# Patient Record
Sex: Female | Born: 1960
Health system: Southern US, Community
[De-identification: ages and names within clinical notes are randomized; demographics above are authoritative.]

## PROBLEM LIST (undated history)

## (undated) DIAGNOSIS — I10 Essential (primary) hypertension: Secondary | ICD-10-CM

## (undated) DIAGNOSIS — C801 Malignant (primary) neoplasm, unspecified: Secondary | ICD-10-CM

## (undated) DIAGNOSIS — R519 Headache, unspecified: Secondary | ICD-10-CM

## (undated) DIAGNOSIS — R51 Headache: Secondary | ICD-10-CM

---

## 1998-11-18 ENCOUNTER — Other Ambulatory Visit: Admission: RE | Admit: 1998-11-18 | Discharge: 1998-11-18 | Payer: Self-pay | Admitting: Gynecology

## 1999-09-08 ENCOUNTER — Other Ambulatory Visit: Admission: RE | Admit: 1999-09-08 | Discharge: 1999-09-08 | Payer: Self-pay | Admitting: Gynecology

## 1999-11-09 ENCOUNTER — Inpatient Hospital Stay (HOSPITAL_COMMUNITY): Admission: RE | Admit: 1999-11-09 | Discharge: 1999-11-11 | Payer: Self-pay | Admitting: Gynecology

## 2000-07-27 ENCOUNTER — Other Ambulatory Visit: Admission: RE | Admit: 2000-07-27 | Discharge: 2000-07-27 | Payer: Self-pay | Admitting: Obstetrics and Gynecology

## 2000-10-08 ENCOUNTER — Ambulatory Visit (HOSPITAL_COMMUNITY): Admission: RE | Admit: 2000-10-08 | Discharge: 2000-10-08 | Payer: Self-pay | Admitting: Obstetrics and Gynecology

## 2002-07-03 ENCOUNTER — Encounter: Payer: Self-pay | Admitting: *Deleted

## 2002-07-03 ENCOUNTER — Inpatient Hospital Stay (HOSPITAL_COMMUNITY): Admission: EM | Admit: 2002-07-03 | Discharge: 2002-07-05 | Payer: Self-pay | Admitting: Emergency Medicine

## 2002-07-04 ENCOUNTER — Encounter: Payer: Self-pay | Admitting: Cardiovascular Disease

## 2015-06-13 ENCOUNTER — Other Ambulatory Visit: Payer: Self-pay

## 2015-06-30 ENCOUNTER — Other Ambulatory Visit: Payer: Self-pay

## 2015-07-18 DIAGNOSIS — Z170 Estrogen receptor positive status [ER+]: Secondary | ICD-10-CM | POA: Diagnosis not present

## 2015-07-18 DIAGNOSIS — C50411 Malignant neoplasm of upper-outer quadrant of right female breast: Secondary | ICD-10-CM | POA: Diagnosis not present

## 2015-08-08 DIAGNOSIS — C50919 Malignant neoplasm of unspecified site of unspecified female breast: Secondary | ICD-10-CM | POA: Diagnosis not present

## 2015-08-25 DIAGNOSIS — Z510 Encounter for antineoplastic radiation therapy: Secondary | ICD-10-CM | POA: Diagnosis not present

## 2015-08-25 DIAGNOSIS — R300 Dysuria: Secondary | ICD-10-CM | POA: Diagnosis not present

## 2015-08-25 DIAGNOSIS — C50411 Malignant neoplasm of upper-outer quadrant of right female breast: Secondary | ICD-10-CM | POA: Diagnosis not present

## 2015-08-26 DIAGNOSIS — R300 Dysuria: Secondary | ICD-10-CM | POA: Diagnosis not present

## 2015-08-26 DIAGNOSIS — Z510 Encounter for antineoplastic radiation therapy: Secondary | ICD-10-CM | POA: Diagnosis not present

## 2015-08-26 DIAGNOSIS — C50411 Malignant neoplasm of upper-outer quadrant of right female breast: Secondary | ICD-10-CM | POA: Diagnosis not present

## 2015-08-27 DIAGNOSIS — Z510 Encounter for antineoplastic radiation therapy: Secondary | ICD-10-CM | POA: Diagnosis not present

## 2015-08-27 DIAGNOSIS — R300 Dysuria: Secondary | ICD-10-CM | POA: Diagnosis not present

## 2015-08-27 DIAGNOSIS — C50411 Malignant neoplasm of upper-outer quadrant of right female breast: Secondary | ICD-10-CM | POA: Diagnosis not present

## 2015-08-28 DIAGNOSIS — Z510 Encounter for antineoplastic radiation therapy: Secondary | ICD-10-CM | POA: Diagnosis not present

## 2015-08-28 DIAGNOSIS — C50411 Malignant neoplasm of upper-outer quadrant of right female breast: Secondary | ICD-10-CM | POA: Diagnosis not present

## 2015-08-28 DIAGNOSIS — R300 Dysuria: Secondary | ICD-10-CM | POA: Diagnosis not present

## 2015-08-29 DIAGNOSIS — Z510 Encounter for antineoplastic radiation therapy: Secondary | ICD-10-CM | POA: Diagnosis not present

## 2015-08-29 DIAGNOSIS — C50411 Malignant neoplasm of upper-outer quadrant of right female breast: Secondary | ICD-10-CM | POA: Diagnosis not present

## 2015-08-29 DIAGNOSIS — R300 Dysuria: Secondary | ICD-10-CM | POA: Diagnosis not present

## 2015-09-01 DIAGNOSIS — Z510 Encounter for antineoplastic radiation therapy: Secondary | ICD-10-CM | POA: Diagnosis not present

## 2015-09-01 DIAGNOSIS — C50411 Malignant neoplasm of upper-outer quadrant of right female breast: Secondary | ICD-10-CM | POA: Diagnosis not present

## 2015-09-01 DIAGNOSIS — R300 Dysuria: Secondary | ICD-10-CM | POA: Diagnosis not present

## 2015-09-02 DIAGNOSIS — R300 Dysuria: Secondary | ICD-10-CM | POA: Diagnosis not present

## 2015-09-02 DIAGNOSIS — C50411 Malignant neoplasm of upper-outer quadrant of right female breast: Secondary | ICD-10-CM | POA: Diagnosis not present

## 2015-09-02 DIAGNOSIS — Z510 Encounter for antineoplastic radiation therapy: Secondary | ICD-10-CM | POA: Diagnosis not present

## 2015-09-03 DIAGNOSIS — Z510 Encounter for antineoplastic radiation therapy: Secondary | ICD-10-CM | POA: Diagnosis not present

## 2015-09-03 DIAGNOSIS — C50411 Malignant neoplasm of upper-outer quadrant of right female breast: Secondary | ICD-10-CM | POA: Diagnosis not present

## 2015-09-03 DIAGNOSIS — R300 Dysuria: Secondary | ICD-10-CM | POA: Diagnosis not present

## 2015-09-04 DIAGNOSIS — C50411 Malignant neoplasm of upper-outer quadrant of right female breast: Secondary | ICD-10-CM | POA: Diagnosis not present

## 2015-09-04 DIAGNOSIS — R300 Dysuria: Secondary | ICD-10-CM | POA: Diagnosis not present

## 2015-09-04 DIAGNOSIS — Z510 Encounter for antineoplastic radiation therapy: Secondary | ICD-10-CM | POA: Diagnosis not present

## 2015-09-08 DIAGNOSIS — C50411 Malignant neoplasm of upper-outer quadrant of right female breast: Secondary | ICD-10-CM | POA: Diagnosis not present

## 2015-09-08 DIAGNOSIS — Z510 Encounter for antineoplastic radiation therapy: Secondary | ICD-10-CM | POA: Diagnosis not present

## 2015-09-08 DIAGNOSIS — R300 Dysuria: Secondary | ICD-10-CM | POA: Diagnosis not present

## 2015-09-09 DIAGNOSIS — Z510 Encounter for antineoplastic radiation therapy: Secondary | ICD-10-CM | POA: Diagnosis not present

## 2015-09-09 DIAGNOSIS — R300 Dysuria: Secondary | ICD-10-CM | POA: Diagnosis not present

## 2015-09-09 DIAGNOSIS — C50411 Malignant neoplasm of upper-outer quadrant of right female breast: Secondary | ICD-10-CM | POA: Diagnosis not present

## 2015-09-10 DIAGNOSIS — R3915 Urgency of urination: Secondary | ICD-10-CM | POA: Diagnosis not present

## 2015-09-10 DIAGNOSIS — Z1389 Encounter for screening for other disorder: Secondary | ICD-10-CM | POA: Diagnosis not present

## 2015-09-10 DIAGNOSIS — Z510 Encounter for antineoplastic radiation therapy: Secondary | ICD-10-CM | POA: Diagnosis not present

## 2015-09-10 DIAGNOSIS — Z124 Encounter for screening for malignant neoplasm of cervix: Secondary | ICD-10-CM | POA: Diagnosis not present

## 2015-09-10 DIAGNOSIS — C50411 Malignant neoplasm of upper-outer quadrant of right female breast: Secondary | ICD-10-CM | POA: Diagnosis not present

## 2015-09-10 DIAGNOSIS — R300 Dysuria: Secondary | ICD-10-CM | POA: Diagnosis not present

## 2015-09-11 DIAGNOSIS — R300 Dysuria: Secondary | ICD-10-CM | POA: Diagnosis not present

## 2015-09-11 DIAGNOSIS — C50411 Malignant neoplasm of upper-outer quadrant of right female breast: Secondary | ICD-10-CM | POA: Diagnosis not present

## 2015-09-11 DIAGNOSIS — Z510 Encounter for antineoplastic radiation therapy: Secondary | ICD-10-CM | POA: Diagnosis not present

## 2015-09-12 DIAGNOSIS — C50411 Malignant neoplasm of upper-outer quadrant of right female breast: Secondary | ICD-10-CM | POA: Diagnosis not present

## 2015-09-12 DIAGNOSIS — R300 Dysuria: Secondary | ICD-10-CM | POA: Diagnosis not present

## 2015-09-12 DIAGNOSIS — Z510 Encounter for antineoplastic radiation therapy: Secondary | ICD-10-CM | POA: Diagnosis not present

## 2015-09-15 DIAGNOSIS — R300 Dysuria: Secondary | ICD-10-CM | POA: Diagnosis not present

## 2015-09-15 DIAGNOSIS — Z510 Encounter for antineoplastic radiation therapy: Secondary | ICD-10-CM | POA: Diagnosis not present

## 2015-09-15 DIAGNOSIS — C50411 Malignant neoplasm of upper-outer quadrant of right female breast: Secondary | ICD-10-CM | POA: Diagnosis not present

## 2015-09-16 DIAGNOSIS — Z510 Encounter for antineoplastic radiation therapy: Secondary | ICD-10-CM | POA: Diagnosis not present

## 2015-09-16 DIAGNOSIS — R300 Dysuria: Secondary | ICD-10-CM | POA: Diagnosis not present

## 2015-09-16 DIAGNOSIS — C50411 Malignant neoplasm of upper-outer quadrant of right female breast: Secondary | ICD-10-CM | POA: Diagnosis not present

## 2015-09-17 DIAGNOSIS — R300 Dysuria: Secondary | ICD-10-CM | POA: Diagnosis not present

## 2015-09-17 DIAGNOSIS — Z510 Encounter for antineoplastic radiation therapy: Secondary | ICD-10-CM | POA: Diagnosis not present

## 2015-09-17 DIAGNOSIS — C50411 Malignant neoplasm of upper-outer quadrant of right female breast: Secondary | ICD-10-CM | POA: Diagnosis not present

## 2015-09-18 DIAGNOSIS — R300 Dysuria: Secondary | ICD-10-CM | POA: Diagnosis not present

## 2015-09-18 DIAGNOSIS — C50411 Malignant neoplasm of upper-outer quadrant of right female breast: Secondary | ICD-10-CM | POA: Diagnosis not present

## 2015-09-18 DIAGNOSIS — Z510 Encounter for antineoplastic radiation therapy: Secondary | ICD-10-CM | POA: Diagnosis not present

## 2015-09-19 DIAGNOSIS — Z510 Encounter for antineoplastic radiation therapy: Secondary | ICD-10-CM | POA: Diagnosis not present

## 2015-09-19 DIAGNOSIS — R300 Dysuria: Secondary | ICD-10-CM | POA: Diagnosis not present

## 2015-09-19 DIAGNOSIS — C50411 Malignant neoplasm of upper-outer quadrant of right female breast: Secondary | ICD-10-CM | POA: Diagnosis not present

## 2015-09-20 DIAGNOSIS — H5213 Myopia, bilateral: Secondary | ICD-10-CM | POA: Diagnosis not present

## 2015-09-22 DIAGNOSIS — Z510 Encounter for antineoplastic radiation therapy: Secondary | ICD-10-CM | POA: Diagnosis not present

## 2015-09-22 DIAGNOSIS — C50411 Malignant neoplasm of upper-outer quadrant of right female breast: Secondary | ICD-10-CM | POA: Diagnosis not present

## 2015-09-23 DIAGNOSIS — Z510 Encounter for antineoplastic radiation therapy: Secondary | ICD-10-CM | POA: Diagnosis not present

## 2015-09-23 DIAGNOSIS — C50411 Malignant neoplasm of upper-outer quadrant of right female breast: Secondary | ICD-10-CM | POA: Diagnosis not present

## 2015-09-24 DIAGNOSIS — Z510 Encounter for antineoplastic radiation therapy: Secondary | ICD-10-CM | POA: Diagnosis not present

## 2015-09-24 DIAGNOSIS — C50411 Malignant neoplasm of upper-outer quadrant of right female breast: Secondary | ICD-10-CM | POA: Diagnosis not present

## 2015-09-25 DIAGNOSIS — Z510 Encounter for antineoplastic radiation therapy: Secondary | ICD-10-CM | POA: Diagnosis not present

## 2015-09-25 DIAGNOSIS — C50411 Malignant neoplasm of upper-outer quadrant of right female breast: Secondary | ICD-10-CM | POA: Diagnosis not present

## 2015-09-26 DIAGNOSIS — C50411 Malignant neoplasm of upper-outer quadrant of right female breast: Secondary | ICD-10-CM | POA: Diagnosis not present

## 2015-09-26 DIAGNOSIS — Z510 Encounter for antineoplastic radiation therapy: Secondary | ICD-10-CM | POA: Diagnosis not present

## 2015-09-29 DIAGNOSIS — Z510 Encounter for antineoplastic radiation therapy: Secondary | ICD-10-CM | POA: Diagnosis not present

## 2015-09-29 DIAGNOSIS — C50411 Malignant neoplasm of upper-outer quadrant of right female breast: Secondary | ICD-10-CM | POA: Diagnosis not present

## 2015-09-30 DIAGNOSIS — C50411 Malignant neoplasm of upper-outer quadrant of right female breast: Secondary | ICD-10-CM | POA: Diagnosis not present

## 2015-09-30 DIAGNOSIS — Z510 Encounter for antineoplastic radiation therapy: Secondary | ICD-10-CM | POA: Diagnosis not present

## 2015-10-01 DIAGNOSIS — C50411 Malignant neoplasm of upper-outer quadrant of right female breast: Secondary | ICD-10-CM | POA: Diagnosis not present

## 2015-10-01 DIAGNOSIS — Z510 Encounter for antineoplastic radiation therapy: Secondary | ICD-10-CM | POA: Diagnosis not present

## 2015-10-02 DIAGNOSIS — Z510 Encounter for antineoplastic radiation therapy: Secondary | ICD-10-CM | POA: Diagnosis not present

## 2015-10-02 DIAGNOSIS — C50411 Malignant neoplasm of upper-outer quadrant of right female breast: Secondary | ICD-10-CM | POA: Diagnosis not present

## 2015-10-03 DIAGNOSIS — C50411 Malignant neoplasm of upper-outer quadrant of right female breast: Secondary | ICD-10-CM | POA: Diagnosis not present

## 2015-10-03 DIAGNOSIS — Z510 Encounter for antineoplastic radiation therapy: Secondary | ICD-10-CM | POA: Diagnosis not present

## 2015-10-06 DIAGNOSIS — Z510 Encounter for antineoplastic radiation therapy: Secondary | ICD-10-CM | POA: Diagnosis not present

## 2015-10-06 DIAGNOSIS — C50411 Malignant neoplasm of upper-outer quadrant of right female breast: Secondary | ICD-10-CM | POA: Diagnosis not present

## 2015-10-07 DIAGNOSIS — Z510 Encounter for antineoplastic radiation therapy: Secondary | ICD-10-CM | POA: Diagnosis not present

## 2015-10-07 DIAGNOSIS — C50411 Malignant neoplasm of upper-outer quadrant of right female breast: Secondary | ICD-10-CM | POA: Diagnosis not present

## 2015-10-08 DIAGNOSIS — C50411 Malignant neoplasm of upper-outer quadrant of right female breast: Secondary | ICD-10-CM | POA: Diagnosis not present

## 2015-10-08 DIAGNOSIS — Z510 Encounter for antineoplastic radiation therapy: Secondary | ICD-10-CM | POA: Diagnosis not present

## 2015-10-09 DIAGNOSIS — C50411 Malignant neoplasm of upper-outer quadrant of right female breast: Secondary | ICD-10-CM | POA: Diagnosis not present

## 2015-10-09 DIAGNOSIS — Z510 Encounter for antineoplastic radiation therapy: Secondary | ICD-10-CM | POA: Diagnosis not present

## 2015-11-07 DIAGNOSIS — C50411 Malignant neoplasm of upper-outer quadrant of right female breast: Secondary | ICD-10-CM | POA: Diagnosis not present

## 2015-11-28 DIAGNOSIS — Z170 Estrogen receptor positive status [ER+]: Secondary | ICD-10-CM | POA: Diagnosis not present

## 2015-11-28 DIAGNOSIS — C50411 Malignant neoplasm of upper-outer quadrant of right female breast: Secondary | ICD-10-CM | POA: Diagnosis not present

## 2015-11-28 DIAGNOSIS — Z8041 Family history of malignant neoplasm of ovary: Secondary | ICD-10-CM | POA: Diagnosis not present

## 2015-11-28 DIAGNOSIS — Z8042 Family history of malignant neoplasm of prostate: Secondary | ICD-10-CM | POA: Diagnosis not present

## 2015-11-28 DIAGNOSIS — C50919 Malignant neoplasm of unspecified site of unspecified female breast: Secondary | ICD-10-CM | POA: Diagnosis not present

## 2015-11-28 DIAGNOSIS — Z853 Personal history of malignant neoplasm of breast: Secondary | ICD-10-CM | POA: Diagnosis not present

## 2015-11-28 DIAGNOSIS — N6459 Other signs and symptoms in breast: Secondary | ICD-10-CM | POA: Diagnosis not present

## 2015-11-28 DIAGNOSIS — Z800 Family history of malignant neoplasm of digestive organs: Secondary | ICD-10-CM | POA: Diagnosis not present

## 2015-11-28 DIAGNOSIS — Z79810 Long term (current) use of selective estrogen receptor modulators (SERMs): Secondary | ICD-10-CM | POA: Diagnosis not present

## 2015-12-03 DIAGNOSIS — N63 Unspecified lump in breast: Secondary | ICD-10-CM | POA: Diagnosis not present

## 2015-12-03 DIAGNOSIS — C50411 Malignant neoplasm of upper-outer quadrant of right female breast: Secondary | ICD-10-CM | POA: Diagnosis not present

## 2015-12-03 DIAGNOSIS — L7634 Postprocedural seroma of skin and subcutaneous tissue following other procedure: Secondary | ICD-10-CM | POA: Diagnosis not present

## 2015-12-17 DIAGNOSIS — Z6830 Body mass index (BMI) 30.0-30.9, adult: Secondary | ICD-10-CM | POA: Diagnosis not present

## 2015-12-17 DIAGNOSIS — F411 Generalized anxiety disorder: Secondary | ICD-10-CM | POA: Diagnosis not present

## 2015-12-17 DIAGNOSIS — S0990XD Unspecified injury of head, subsequent encounter: Secondary | ICD-10-CM | POA: Diagnosis not present

## 2015-12-17 DIAGNOSIS — G43009 Migraine without aura, not intractable, without status migrainosus: Secondary | ICD-10-CM | POA: Diagnosis not present

## 2015-12-31 DIAGNOSIS — C50411 Malignant neoplasm of upper-outer quadrant of right female breast: Secondary | ICD-10-CM | POA: Diagnosis not present

## 2016-03-15 DIAGNOSIS — J0120 Acute ethmoidal sinusitis, unspecified: Secondary | ICD-10-CM | POA: Diagnosis not present

## 2016-03-15 DIAGNOSIS — Z23 Encounter for immunization: Secondary | ICD-10-CM | POA: Diagnosis not present

## 2016-05-28 DIAGNOSIS — Z170 Estrogen receptor positive status [ER+]: Secondary | ICD-10-CM | POA: Diagnosis not present

## 2016-05-28 DIAGNOSIS — Z853 Personal history of malignant neoplasm of breast: Secondary | ICD-10-CM | POA: Diagnosis not present

## 2016-05-28 DIAGNOSIS — Z79810 Long term (current) use of selective estrogen receptor modulators (SERMs): Secondary | ICD-10-CM | POA: Diagnosis not present

## 2016-05-28 DIAGNOSIS — C50411 Malignant neoplasm of upper-outer quadrant of right female breast: Secondary | ICD-10-CM | POA: Diagnosis not present

## 2016-05-31 DIAGNOSIS — H52223 Regular astigmatism, bilateral: Secondary | ICD-10-CM | POA: Diagnosis not present

## 2016-07-07 DIAGNOSIS — H6983 Other specified disorders of Eustachian tube, bilateral: Secondary | ICD-10-CM | POA: Diagnosis not present

## 2016-07-07 DIAGNOSIS — R509 Fever, unspecified: Secondary | ICD-10-CM | POA: Diagnosis not present

## 2016-07-07 DIAGNOSIS — J111 Influenza due to unidentified influenza virus with other respiratory manifestations: Secondary | ICD-10-CM | POA: Diagnosis not present

## 2016-09-24 DIAGNOSIS — Z853 Personal history of malignant neoplasm of breast: Secondary | ICD-10-CM | POA: Diagnosis not present

## 2016-09-24 DIAGNOSIS — Z923 Personal history of irradiation: Secondary | ICD-10-CM | POA: Diagnosis not present

## 2016-09-24 DIAGNOSIS — Z170 Estrogen receptor positive status [ER+]: Secondary | ICD-10-CM | POA: Diagnosis not present

## 2016-09-24 DIAGNOSIS — Z79810 Long term (current) use of selective estrogen receptor modulators (SERMs): Secondary | ICD-10-CM | POA: Diagnosis not present

## 2016-09-24 DIAGNOSIS — C50911 Malignant neoplasm of unspecified site of right female breast: Secondary | ICD-10-CM | POA: Diagnosis not present

## 2016-09-27 DIAGNOSIS — J309 Allergic rhinitis, unspecified: Secondary | ICD-10-CM | POA: Diagnosis not present

## 2016-09-27 DIAGNOSIS — J329 Chronic sinusitis, unspecified: Secondary | ICD-10-CM | POA: Diagnosis not present

## 2016-09-27 DIAGNOSIS — J40 Bronchitis, not specified as acute or chronic: Secondary | ICD-10-CM | POA: Diagnosis not present

## 2016-10-07 DIAGNOSIS — R928 Other abnormal and inconclusive findings on diagnostic imaging of breast: Secondary | ICD-10-CM | POA: Diagnosis not present

## 2016-10-07 DIAGNOSIS — C50411 Malignant neoplasm of upper-outer quadrant of right female breast: Secondary | ICD-10-CM | POA: Diagnosis not present

## 2016-11-08 DIAGNOSIS — Z6831 Body mass index (BMI) 31.0-31.9, adult: Secondary | ICD-10-CM | POA: Diagnosis not present

## 2016-11-08 DIAGNOSIS — K2970 Gastritis, unspecified, without bleeding: Secondary | ICD-10-CM | POA: Diagnosis not present

## 2017-01-27 DIAGNOSIS — Z79810 Long term (current) use of selective estrogen receptor modulators (SERMs): Secondary | ICD-10-CM | POA: Diagnosis not present

## 2017-01-27 DIAGNOSIS — Z923 Personal history of irradiation: Secondary | ICD-10-CM | POA: Diagnosis not present

## 2017-01-27 DIAGNOSIS — C50911 Malignant neoplasm of unspecified site of right female breast: Secondary | ICD-10-CM | POA: Diagnosis not present

## 2017-01-27 DIAGNOSIS — Z170 Estrogen receptor positive status [ER+]: Secondary | ICD-10-CM | POA: Diagnosis not present

## 2017-01-27 DIAGNOSIS — Z853 Personal history of malignant neoplasm of breast: Secondary | ICD-10-CM | POA: Diagnosis not present

## 2017-03-20 DIAGNOSIS — Z23 Encounter for immunization: Secondary | ICD-10-CM | POA: Diagnosis not present

## 2017-05-14 DIAGNOSIS — R911 Solitary pulmonary nodule: Secondary | ICD-10-CM | POA: Diagnosis not present

## 2017-05-14 DIAGNOSIS — R9431 Abnormal electrocardiogram [ECG] [EKG]: Secondary | ICD-10-CM | POA: Diagnosis not present

## 2017-05-14 DIAGNOSIS — R531 Weakness: Secondary | ICD-10-CM | POA: Diagnosis not present

## 2017-05-14 DIAGNOSIS — R05 Cough: Secondary | ICD-10-CM | POA: Diagnosis not present

## 2017-05-15 ENCOUNTER — Observation Stay (HOSPITAL_COMMUNITY): Payer: BLUE CROSS/BLUE SHIELD

## 2017-05-15 ENCOUNTER — Observation Stay (HOSPITAL_COMMUNITY)
Admission: AD | Admit: 2017-05-15 | Discharge: 2017-05-15 | Disposition: A | Discharge status: Home / Self Care | Payer: BLUE CROSS/BLUE SHIELD | Source: Other Acute Inpatient Hospital | Attending: Internal Medicine | Admitting: Internal Medicine

## 2017-05-15 ENCOUNTER — Encounter (HOSPITAL_COMMUNITY): Payer: Self-pay | Admitting: *Deleted

## 2017-05-15 ENCOUNTER — Observation Stay (HOSPITAL_BASED_OUTPATIENT_CLINIC_OR_DEPARTMENT_OTHER): Payer: BLUE CROSS/BLUE SHIELD

## 2017-05-15 ENCOUNTER — Other Ambulatory Visit: Payer: Self-pay

## 2017-05-15 DIAGNOSIS — R51 Headache: Secondary | ICD-10-CM | POA: Diagnosis not present

## 2017-05-15 DIAGNOSIS — Z79899 Other long term (current) drug therapy: Secondary | ICD-10-CM | POA: Diagnosis not present

## 2017-05-15 DIAGNOSIS — E876 Hypokalemia: Secondary | ICD-10-CM | POA: Insufficient documentation

## 2017-05-15 DIAGNOSIS — R9431 Abnormal electrocardiogram [ECG] [EKG]: Secondary | ICD-10-CM | POA: Diagnosis not present

## 2017-05-15 DIAGNOSIS — I5030 Unspecified diastolic (congestive) heart failure: Secondary | ICD-10-CM | POA: Diagnosis not present

## 2017-05-15 DIAGNOSIS — Z853 Personal history of malignant neoplasm of breast: Secondary | ICD-10-CM | POA: Diagnosis not present

## 2017-05-15 DIAGNOSIS — Z882 Allergy status to sulfonamides status: Secondary | ICD-10-CM | POA: Insufficient documentation

## 2017-05-15 DIAGNOSIS — Z885 Allergy status to narcotic agent status: Secondary | ICD-10-CM | POA: Diagnosis not present

## 2017-05-15 DIAGNOSIS — I10 Essential (primary) hypertension: Secondary | ICD-10-CM | POA: Diagnosis not present

## 2017-05-15 DIAGNOSIS — R42 Dizziness and giddiness: Principal | ICD-10-CM

## 2017-05-15 DIAGNOSIS — F329 Major depressive disorder, single episode, unspecified: Secondary | ICD-10-CM | POA: Insufficient documentation

## 2017-05-15 DIAGNOSIS — R519 Headache, unspecified: Secondary | ICD-10-CM | POA: Diagnosis present

## 2017-05-15 HISTORY — DX: Headache: R51

## 2017-05-15 HISTORY — DX: Malignant (primary) neoplasm, unspecified: C80.1

## 2017-05-15 HISTORY — DX: Headache, unspecified: R51.9

## 2017-05-15 HISTORY — DX: Essential (primary) hypertension: I10

## 2017-05-15 LAB — COMPREHENSIVE METABOLIC PANEL
ALBUMIN: 3.3 g/dL — AB (ref 3.5–5.0)
ALK PHOS: 49 U/L (ref 38–126)
ALT: 19 U/L (ref 14–54)
ANION GAP: 6 (ref 5–15)
AST: 18 U/L (ref 15–41)
BUN: 13 mg/dL (ref 6–20)
CALCIUM: 8.5 mg/dL — AB (ref 8.9–10.3)
CHLORIDE: 106 mmol/L (ref 101–111)
CO2: 25 mmol/L (ref 22–32)
Creatinine, Ser: 0.7 mg/dL (ref 0.44–1.00)
GFR calc non Af Amer: >60 mL/min (ref >60)
GLUCOSE: 96 mg/dL (ref 65–99)
POTASSIUM: 3 mmol/L — AB (ref 3.5–5.1)
SODIUM: 137 mmol/L (ref 135–145)
Total Bilirubin: 0.8 mg/dL (ref 0.3–1.2)
Total Protein: 6 g/dL — ABNORMAL LOW (ref 6.5–8.1)

## 2017-05-15 LAB — HEMOGLOBIN A1C
HEMOGLOBIN A1C: 5.4 % (ref 4.8–5.6)
MEAN PLASMA GLUCOSE: 108.28 mg/dL

## 2017-05-15 LAB — CBC
HCT: 37.7 % (ref 36.0–46.0)
Hemoglobin: 12.8 g/dL (ref 12.0–15.0)
MCH: 30.5 pg (ref 26.0–34.0)
MCHC: 34 g/dL (ref 30.0–36.0)
MCV: 90 fL (ref 78.0–100.0)
PLATELETS: 162 10*3/uL (ref 150–400)
RBC: 4.19 MIL/uL (ref 3.87–5.11)
RDW: 13 % (ref 11.5–15.5)
WBC: 7.8 10*3/uL (ref 4.0–10.5)

## 2017-05-15 LAB — TROPONIN I

## 2017-05-15 LAB — BRAIN NATRIURETIC PEPTIDE: B NATRIURETIC PEPTIDE 5: 28.6 pg/mL (ref 0.0–100.0)

## 2017-05-15 LAB — LIPID PANEL
CHOL/HDL RATIO: 3.1 ratio
Cholesterol: 130 mg/dL (ref 0–200)
HDL: 42 mg/dL (ref >40)
LDL CALC: 54 mg/dL (ref 0–99)
Triglycerides: 171 mg/dL — ABNORMAL HIGH (ref <150)
VLDL: 34 mg/dL (ref 0–40)

## 2017-05-15 LAB — HIV ANTIBODY (ROUTINE TESTING W REFLEX): HIV SCREEN 4TH GENERATION: NONREACTIVE

## 2017-05-15 LAB — MAGNESIUM: Magnesium: 2 mg/dL (ref 1.7–2.4)

## 2017-05-15 MED ORDER — LOSARTAN POTASSIUM 100 MG PO TABS: 100.0000 mg | ORAL_TABLET | Freq: Every day | ORAL | 0 refills | Status: AC

## 2017-05-15 MED ORDER — PERFLUTREN LIPID MICROSPHERE
1.0000 mL | INTRAVENOUS | Status: DC | PRN
  Administered 2017-05-15: 4 mL via INTRAVENOUS
  Filled 2017-05-15: qty 10

## 2017-05-15 MED ORDER — ASPIRIN 300 MG RE SUPP: 300.0000 mg | RECTAL | Status: AC

## 2017-05-15 MED ORDER — SODIUM CHLORIDE 0.9 % IV SOLN: 250.0000 mL | INTRAVENOUS | Status: DC | PRN

## 2017-05-15 MED ORDER — ASPIRIN 81 MG PO CHEW: 324.0000 mg | CHEWABLE_TABLET | ORAL | Status: AC

## 2017-05-15 MED ORDER — SODIUM CHLORIDE 0.9% FLUSH: 3.0000 mL | INTRAVENOUS | Status: DC | PRN

## 2017-05-15 MED ORDER — ACETAMINOPHEN 325 MG PO TABS: 650.0000 mg | ORAL_TABLET | ORAL | Status: DC | PRN

## 2017-05-15 MED ORDER — HYDROCHLOROTHIAZIDE 25 MG PO TABS
25.0000 mg | ORAL_TABLET | Freq: Every day | ORAL | Status: DC
  Filled 2017-05-15: qty 1

## 2017-05-15 MED ORDER — ENOXAPARIN SODIUM 40 MG/0.4ML ~~LOC~~ SOLN
40.0000 mg | Freq: Every day | SUBCUTANEOUS | Status: DC
  Administered 2017-05-15: 40 mg via SUBCUTANEOUS
  Filled 2017-05-15: qty 0.4

## 2017-05-15 MED ORDER — SODIUM CHLORIDE 0.9% FLUSH
3.0000 mL | Freq: Two times a day (BID) | INTRAVENOUS | Status: DC
  Administered 2017-05-15: 3 mL via INTRAVENOUS

## 2017-05-15 MED ORDER — MECLIZINE HCL 25 MG PO TABS: 25.0000 mg | ORAL_TABLET | Freq: Three times a day (TID) | ORAL | 0 refills | Status: AC | PRN

## 2017-05-15 MED ORDER — MECLIZINE HCL 25 MG PO TABS: 25.0000 mg | ORAL_TABLET | Freq: Three times a day (TID) | ORAL | Status: DC | PRN

## 2017-05-15 MED ORDER — HYDROCODONE-ACETAMINOPHEN 5-325 MG PO TABS
1.0000 | ORAL_TABLET | ORAL | Status: DC | PRN
  Administered 2017-05-15: 1 via ORAL
  Filled 2017-05-15: qty 1

## 2017-05-15 MED ORDER — GADOBENATE DIMEGLUMINE 529 MG/ML IV SOLN
15.0000 mL | Freq: Once | INTRAVENOUS | Status: AC
  Administered 2017-05-15: 15 mL via INTRAVENOUS

## 2017-05-15 MED ORDER — ASPIRIN 81 MG PO TBEC: 81.0000 mg | DELAYED_RELEASE_TABLET | Freq: Every day | ORAL | 0 refills | Status: AC

## 2017-05-15 MED ORDER — NITROGLYCERIN 0.4 MG SL SUBL: 0.4000 mg | SUBLINGUAL_TABLET | SUBLINGUAL | Status: DC | PRN

## 2017-05-15 MED ORDER — KETOROLAC TROMETHAMINE 30 MG/ML IJ SOLN: 30.0000 mg | Freq: Four times a day (QID) | INTRAMUSCULAR | Status: DC | PRN

## 2017-05-15 MED ORDER — BISACODYL 5 MG PO TBEC: 5.0000 mg | DELAYED_RELEASE_TABLET | Freq: Every day | ORAL | Status: DC | PRN

## 2017-05-15 MED ORDER — ENOXAPARIN SODIUM 40 MG/0.4ML ~~LOC~~ SOLN: 40.0000 mg | SUBCUTANEOUS | Status: DC

## 2017-05-15 MED ORDER — LOSARTAN POTASSIUM 50 MG PO TABS
100.0000 mg | ORAL_TABLET | Freq: Every day | ORAL | Status: DC
  Administered 2017-05-15: 100 mg via ORAL
  Filled 2017-05-15: qty 2

## 2017-05-15 MED ORDER — POTASSIUM CHLORIDE CRYS ER 20 MEQ PO TBCR
40.0000 meq | EXTENDED_RELEASE_TABLET | Freq: Once | ORAL | Status: AC
  Administered 2017-05-15: 40 meq via ORAL
  Filled 2017-05-15: qty 2

## 2017-05-15 MED ORDER — ASPIRIN EC 81 MG PO TBEC
81.0000 mg | DELAYED_RELEASE_TABLET | Freq: Every day | ORAL | Status: DC
  Administered 2017-05-15: 81 mg via ORAL
  Filled 2017-05-15: qty 1

## 2017-05-15 MED ORDER — SODIUM CHLORIDE 0.9% FLUSH
3.0000 mL | Freq: Two times a day (BID) | INTRAVENOUS | Status: DC
Start: 2017-05-15 — End: 2017-05-15
  Administered 2017-05-15: 3 mL via INTRAVENOUS

## 2017-05-15 MED ORDER — ONDANSETRON HCL 4 MG/2ML IJ SOLN: 4.0000 mg | Freq: Four times a day (QID) | INTRAMUSCULAR | Status: DC | PRN

## 2017-05-15 MED ORDER — SENNOSIDES-DOCUSATE SODIUM 8.6-50 MG PO TABS: 1.0000 | ORAL_TABLET | Freq: Every evening | ORAL | Status: DC | PRN

## 2017-05-15 NOTE — Evaluation (Addendum)
Physical Therapy Evaluation/Vestibular Assessment Patient Details Name: Deanna Sanchez MRN: [id number] DOB: 1961/04/03 Today's Date: 05/15/2017   History of Present Illness  56 y.o. female admitted with dizziness and nausea.  Dx is suspicious for vestibular dysfunction.  She also has some T wave inversion on EKG, so cardiology has been consulted.  Pt also with hypokalemia.  MRI negative for acute infarct, but does show some old cerebellar infarcts (but nothing to explain this acute episode). Pt with significant PMH of HTN, HA, CA, and per pt episodic vertigo x 1 year.   Clinical Impression  Vestibular assessment completed.  Pt presents with a L ear hypofunction of unknown origin.  Marye Round and horizontal canal testing was mildly symptomatic, but lacked traditional nystagmus and significant spinning symptoms.  Pt has not had meclizine today.  She is unsteady on her feet, and I initiated compensatory strategies and seated x 1 exercises to help with her gaze stability.  She lives closer to [place] than here and would benefit from seeing one of my colleges at [employer] that specializes in Vestibular assessment and treatment.   PT to follow acutely for deficits listed below.       Follow Up Recommendations Outpatient PT;Other (comment)(for vesitublar and balance therapy -[facility] in [place])    Equipment Recommendations  None recommended by PT    Recommendations for Other Services   NA    Precautions / Restrictions Precautions Precautions: Fall Precaution Comments: mildly unsteady on her feet, especially with head turns during gait.  Restrictions Weight Bearing Restrictions: No      Mobility  Bed Mobility Overal bed mobility: Independent                Transfers Overall transfer level: Modified independent               General transfer comment: slower than I would anticipate as normal.  Discussed sitting for a minute before standing and standing for a  minute before walking for safety.  And we also discussed visual targeting to help decreased sensation of dizziness.   Ambulation/Gait Ambulation/Gait assistance: Min guard Ambulation Distance (Feet): 150 Feet Assistive device: 1 person hand held assist Gait Pattern/deviations: Staggering left;Staggering right Gait velocity: decreased Gait velocity interpretation: Below normal speed for age/gender General Gait Details: mildly staggering gait pattern with min guard hand held assist.  Staggering intensifies with head turns and turning in general.  Pt walking slower and more cautiously than her normal gait speed.          Balance Overall balance assessment: Needs assistance Sitting-balance support: Feet supported;No upper extremity supported Sitting balance-Leahy Scale: Good     Standing balance support: No upper extremity supported Standing balance-Leahy Scale: Fair         05/15/17 0947  Vestibular Assessment  General Observation Wears bifocals, no hearing loss, no tinnitus (currently), no HA (currently), no recent head trauma, but does have h/o skull fx 8-10 years ago, no double vision or blurry vision, MRI shows old cerebellar infarcts, vision due to be checked in Jan, no recent URI or antibiotic use, no changes in meds.  Fells off balance, swaing.    Symptom Behavior  Type of Dizziness Imbalance  Frequency of Dizziness when up and moving  Duration of Dizziness near constant when up  Aggravating Factors Turning head quickly;Turning body quickly;Activity in general  Relieving Factors Lying supine;Closing eyes  Occulomotor Exam  Occulomotor Alignment Normal  Spontaneous Absent  Gaze-induced Right beating nystagmus with  R gaze  Smooth Pursuits Comment (nystagmus (horizontal) right beating noted), test of skew was negative.    Saccades Dysmetria  Vestibulo-Occular Reflex  VOR 1 Head Only (x 1 viewing) vertical is much more difficult and symptomatic than horizontal  Comment HIT  (+) L  Auditory  Comments grossly equal with finger rub test  Other Tests  Tragal negative  Positional Testing  Dix-Hallpike Dix-Hallpike Right;Dix-Hallpike Left  Horizontal Canal Testing Horizontal Canal Right;Horizontal Canal Left  Dix-Hallpike Right  Dix-Hallpike Right Duration 0  Dix-Hallpike Right Symptoms No nystagmus;Other (comment) (some very mild symptoms with movement)  Dix-Hallpike Left  Dix-Hallpike Left Duration 0  Dix-Hallpike Left Symptoms No nystagmus;Other (comment) (mildy symptomatic with movement, but not worse than R)  Horizontal Canal Right  Horizontal Canal Right Duration 0  Horizontal Canal Right Symptoms Normal  Horizontal Canal Left  Horizontal Canal Left Duration 0  Horizontal Canal Left Symptoms Normal        05/15/2017 Given x 1 seated horizontal and vertical exercises given and reviewed.                       Pertinent Vitals/Pain Pain Assessment: No/denies pain    Home Living Family/patient expects to be discharged to:: Private residence Living Arrangements: Spouse/significant other Available Help at Discharge: Family;Available 24 hours/day(until Thursday 12/27 when husband returns to work) Type of Home: House Home Access: Stairs to enter     Home Layout: Two level;Able to live on main level with bedroom/bathroom Home Equipment: None      Prior Function Level of Independence: Independent         Comments: Pt drives, works full time     Journalist, newspaper   Dominant Hand: Right    Extremity/Trunk Assessment   Upper Extremity Assessment Upper Extremity Assessment: Overall WFL for tasks assessed    Lower Extremity Assessment Lower Extremity Assessment: Overall WFL for tasks assessed    Cervical / Trunk Assessment Cervical / Trunk Assessment: Normal  Communication   Communication: No difficulties  Cognition Arousal/Alertness: Awake/alert Behavior During Therapy: WFL for tasks assessed/performed Overall Cognitive  Status: Within Functional Limits for tasks assessed                                               Assessment/Plan    PT Assessment Patient needs continued PT services  PT Problem List Decreased activity tolerance;Decreased balance;Decreased mobility       PT Treatment Interventions DME instruction;Gait training;Stair training;Functional mobility training;Therapeutic exercise;Therapeutic activities;Balance training;Neuromuscular re-education;Patient/family education    PT Goals (Current goals can be found in the Care Plan section)  Acute Rehab PT Goals Patient Stated Goal: to get better and get home for the holidays PT Goal Formulation: With patient Time For Goal Achievement: 05/29/17 Potential to Achieve Goals: Good    Frequency Min 4X/week           AM-PAC PT "6 Clicks" Daily Activity  Outcome Measure Difficulty turning over in bed (including adjusting bedclothes, sheets and blankets)?: None Difficulty moving from lying on back to sitting on the side of the bed? : None Difficulty sitting down on and standing up from a chair with arms (e.g., wheelchair, bedside commode, etc,.)?: None Help needed moving to and from a bed to chair (including a wheelchair)?: A Little Help needed walking in hospital room?: A Little Help needed climbing  3-5 steps with a railing? : A Little 6 Click Score: 21    End of Session   Activity Tolerance: Other (comment)(limited by dizziness) Patient left: in bed;with call bell/phone within reach   PT Visit Diagnosis: Unsteadiness on feet (R26.81);Dizziness and giddiness (R42)    Time: 2330-0762 PT Time Calculation (min) (ACUTE ONLY): 27 min   Charges:   PT Evaluation $PT Eval Moderate Complexity: 1 Mod PT Treatments $Therapeutic Activity: 8-22 mins   PT G Codes:           Timika Muench B. Rodderick Holtzer, PT, DPT [phone]   PT G-Codes **NOT FOR INPATIENT CLASS** Functional Assessment Tool Used: AM-PAC 6 Clicks Basic  Mobility Functional Limitation: Mobility: Walking and moving around Mobility: Walking and Moving Around Current Status (T6256): At least 20 percent but less than 40 percent impaired, limited or restricted Mobility: Walking and Moving Around Goal Status [phone]): 0 percent impaired, limited or restricted    05/15/2017, 9:51 AM

## 2017-05-15 NOTE — H&P (Addendum)
History and Physical    Deanna Sanchez URK:[id number] DOB: 08-Apr-1961 DOA: 05/15/2017  PCP: Ronita Hipps, MD   Patient coming from: Home, by way of [facility] ED  Chief Complaint: Fatigue, malaise   HPI: Deanna Sanchez is a 56 y.o. female with medical history significant for hypertension, depression, and history of breast cancer status post resection, now presenting to the emergency department with 1 day of vertigo.  She reports developing a sensation as though the room was spinning, associated with mild nausea yesterday morning when she got up to use the bathroom.  Initially, the symptoms would improve when she laid down and closed her eyes, but have been worsening to the point where the symptoms are more persistent.  There is mild nausea associated with this, but no abdominal pain and no vomiting.  She denies any chest pain or shortness of breath, though she had a mild dry cough yesterday.  She did experience similar symptoms previously that were less severe.  Denies headache, change in vision or hearing, or focal numbness or weakness.  [facility] ED Course: Upon arrival to the ED, patient is found to be afebrile, saturating well on room air, and with vitals otherwise stable.  EKG features a sinus rhythm with T wave inversion in lead I and aVL.  Chest x-rays negative for acute cardiopulmonary disease.  Chemistry panel reveals a potassium of 3.3 and CBC is unremarkable.  Troponin is undetectable.  There was concern that the patient's presenting complaints could represent a form of angina, and she was started on heparin infusion and nitroglycerin ointment was placed.  Cardiology was consulted by the ED physician and hospitalist admission to the telemetry unit at [facility] was arranged.  Review of Systems:  All other systems reviewed and apart from HPI, are negative.  Past Medical History:  Diagnosis Date  . Cancer ([place])   . Headache   . Hypertension     History reviewed. No  pertinent surgical history.   reports that  has never smoked. she has never used smokeless tobacco. She reports that she does not drink alcohol or use drugs.  Allergies not on file  Family History  Problem Relation Age of Onset  . Coronary artery disease Neg Hx      Prior to Admission medications   Not on File    Physical Exam: Vitals:   05/15/17 0126  BP: 139/83  Pulse: 69  Resp: 18  Temp: 97.9 F (36.6 C)  TempSrc: Oral  SpO2: 97%  Weight: 73.3 kg (161 lb 9.6 oz)  Height: 5\' 1"  (1.549 m)      Constitutional: NAD, calm, comfortable Eyes: PERTLA, lids and conjunctivae normal ENMT: Mucous membranes are moist. Posterior pharynx clear of any exudate or lesions.   Neck: normal, supple, no masses, no thyromegaly Respiratory: clear to auscultation bilaterally, no wheezing, no crackles. Normal respiratory effort.   Cardiovascular: S1 & S2 heard, regular rate and rhythm. No significant JVD. Abdomen: No distension, no tenderness, no masses palpated. Bowel sounds normal.  Musculoskeletal: no clubbing / cyanosis. No joint deformity upper and lower extremities.   Skin: no significant rashes, lesions, ulcers. Warm, dry, well-perfused. Neurologic: CN 2-12 grossly intact. Sensation intact. Strength 5/5 in all 4 limbs.  Psychiatric: Alert and oriented x 3. Pleasant and cooperative.     Labs on Admission: I have personally reviewed following labs and imaging studies  CBC: No results for input(s): WBC, NEUTROABS, HGB, HCT, MCV, PLT in the last 168 hours.  Basic Metabolic Panel: No results for input(s): NA, K, CL, CO2, GLUCOSE, BUN, CREATININE, CALCIUM, MG, PHOS in the last 168 hours. GFR: CrCl cannot be calculated (No order found.). Liver Function Tests: No results for input(s): AST, ALT, ALKPHOS, BILITOT, PROT, ALBUMIN in the last 168 hours. No results for input(s): LIPASE, AMYLASE in the last 168 hours. No results for input(s): AMMONIA in the last 168 hours. Coagulation  Profile: No results for input(s): INR, PROTIME in the last 168 hours. Cardiac Enzymes: No results for input(s): CKTOTAL, CKMB, CKMBINDEX, TROPONINI in the last 168 hours. BNP (last 3 results) No results for input(s): PROBNP in the last 8760 hours. HbA1C: No results for input(s): HGBA1C in the last 72 hours. CBG: No results for input(s): GLUCAP in the last 168 hours. Lipid Profile: No results for input(s): CHOL, HDL, LDLCALC, TRIG, CHOLHDL, LDLDIRECT in the last 72 hours. Thyroid Function Tests: No results for input(s): TSH, T4TOTAL, FREET4, T3FREE, THYROIDAB in the last 72 hours. Anemia Panel: No results for input(s): VITAMINB12, FOLATE, FERRITIN, TIBC, IRON, RETICCTPCT in the last 72 hours. Urine analysis: No results found for: COLORURINE, APPEARANCEUR, LABSPEC, PHURINE, GLUCOSEU, HGBUR, BILIRUBINUR, KETONESUR, PROTEINUR, UROBILINOGEN, NITRITE, LEUKOCYTESUR Sepsis Labs: @LABRCNTIP (procalcitonin:4,lacticidven:4) )No results found for this or any previous visit (from the past 240 hour(s)).   Radiological Exams on Admission: No results found.  EKG: Independently reviewed. Sinus rhythm, T-wave inversion in leads I and aVL.   Assessment/Plan  1. Vertigo  - Pt presents with a sensation that the room is spinning, associated with off-balance gait, and mild nausea  - Sxs were initially better with laying down, but now more persistent  - No definite focal neurologic abnormality detected on exam  - Plan to check MRI to rule-out a central etiology, continue symptomatic care with prn meclizine and Zofran    2. EKG abnormalities  - Patient presents with vertigo, found to have T-wave inversion in leads I and aVL with no prior EKG available  - There was concern that her presenting complaints could represent a form of angina and she was started on heparin infusion and nitroglycerin ointment at the outside hospital  - There has not been any chest pain or dyspnea associated with this, troponin  was undetectable, repeat EKG is unchanged, no hx of CAD  - Will check another troponin and continue cardiac monitoring, but feel that ACS is very unlikely and will stop the heparin infusion and nitroglycerin  - Cardiology had been consulted by ED physician; I discussed case with cardiologist who reviewed EKG and agrees with plan   3. Hypertension  - BP at goal  - Continue losartan and HCTZ    4. Hypokalemia  - Serum potassium is 3.3 on admission - Treated with 40 mEq oral potassium   5. Hx of breast cancer  - Pt has hx of right breast cancer s/p resection  - continue tamoxifen and outpatient follow-up    DVT prophylaxis: Lovenox  Code Status: Full  Family Communication: Discussed with patient Disposition Plan: Observe on telemetry Consults called: Cardiology reviewed case  Admission status: Observation    Vianne Bulls, MD [facility] Pager [phone]  If 7PM-7AM, please contact night-coverage [email or web address] Password [email or web address]  05/15/2017, 2:51 AM

## 2017-05-15 NOTE — Progress Notes (Signed)
[facility] PROGRESS NOTE  Deanna Sanchez DTO:[id number] DOB: Apr 01, 1961 DOA: 05/15/2017 PCP: Ronita Hipps, MD  Brief summary   56 y.o. female with medical history significant for hypertension, depression, and history of breast cancer status post resection, now presenting to the emergency department with 1 day of vertigo.  She reports developing a sensation as though the room was spinning, associated with mild nausea yesterday morning when she got up to use the bathroom.  Initially, the symptoms would improve when she laid down and closed her eyes, but have been worsening to the point where the symptoms are more persistent.  There is mild nausea associated with this, but no abdominal pain and no vomiting.  She denies any chest pain or shortness of breath, though she had a mild dry cough yesterday.  She did experience similar symptoms previously that were less severe.  Denies headache, change in vision or hearing, or focal numbness or weakness.  [facility] ED Course: Upon arrival to the ED, patient is found to be afebrile, saturating well on room air, and with vitals otherwise stable.  EKG features a sinus rhythm with T wave inversion in lead I and aVL.  Chest x-rays negative for acute cardiopulmonary disease.  Chemistry panel reveals a potassium of 3.3 and CBC is unremarkable.  Troponin is undetectable.  There was concern that the patient's presenting complaints could represent a form of angina, and she was started on heparin infusion and nitroglycerin ointment was placed.  Cardiology was consulted by the ED physician and hospitalist admission to the telemetry unit at [facility] was arranged.   Assessment/Plan:  Vertigo. Unclear etiology. questionable BPPV. Exam+ dix haulpike (+symptoms and+ nistagmus.  However, vertigo lasted several hours yesterday. MRI brain: No acute intracranial abnormality. 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 ? TIA vs complicated migraine.   -will obtain full tia/stroke work up with echo, carotids, will check mri with contrast due to h/o breast CA. Will get mra head/neck for post circ eval due to h/o cerebellar strokes (incidental diagnosis). Cont aspirin. Obtain pt eval for epley.   EKG abnormalities. No acute cardiopulmonary symptoms. Trop x1 neg. F/u serial trop. Echo.   Hypertension. Stable. continue losartan. Hold hctz due to vertigo   Hypokalemia. Serum potassium is 3.3 on admission. Treated with 40 mEq oral potassium   Hx of breast cancer. Pt has hx of right breast cancer s/p resection. continue tamoxifen and outpatient follow-up     Code Status: full Family Communication: d/w patient, RN (indicate person spoken with, relationship, and if by phone, the number) Disposition Plan: home pend work up. Pt    Consultants:  Neurology   Procedures:  Pend echo   Antibiotics:  none (indicate start date, and stop date if known)  HPI/Subjective: Alert, reports feeling better. Non focal exam. Reports all day vertigo yesterday. But dix haulpike +  Objective: Vitals:   05/15/17 0126 05/15/17 0512  BP: 139/83 132/79  Pulse: 69 74  Resp: 18 16  Temp: 97.9 F (36.6 C) 98.3 F (36.8 C)  SpO2: 97% 97%   No intake or output data in the 24 hours ending 05/15/17 0911 Filed Weights   05/15/17 0126  Weight: 73.3 kg (161 lb 9.6 oz)    Exam:   General:  No distress   Cardiovascular: s1,s2 rrr  Respiratory: CTA BL  Abdomen: soft, nt, nd   Musculoskeletal: no leg edema  Neuro: CN2-12 intact. Motor/sensory intact. +dix haulpike    Data Reviewed: Basic Metabolic Panel: Recent  Labs  Lab 05/15/17 0213  NA 137  K 3.0*  CL 106  CO2 25  GLUCOSE 96  BUN 13  CREATININE 0.70  CALCIUM 8.5*  MG 2.0   Liver Function Tests: Recent Labs  Lab 05/15/17 0213  AST 18  ALT 19  ALKPHOS 49  BILITOT 0.8  PROT 6.0*  ALBUMIN 3.3*   No results for input(s): LIPASE, AMYLASE in the last 168 hours. No results  for input(s): AMMONIA in the last 168 hours. CBC: Recent Labs  Lab 05/15/17 0213  WBC 7.8  HGB 12.8  HCT 37.7  MCV 90.0  PLT 162   Cardiac Enzymes: Recent Labs  Lab 05/15/17 0213  TROPONINI <0.03   BNP (last 3 results) Recent Labs    05/15/17 0213  BNP 28.6    ProBNP (last 3 results) No results for input(s): PROBNP in the last 8760 hours.  CBG: No results for input(s): GLUCAP in the last 168 hours.  No results found for this or any previous visit (from the past 240 hour(s)).   Studies: Mr Brain Wo Contrast  Result Date: 05/15/2017 CLINICAL DATA:  Vertigo/dizziness.  History of breast cancer. EXAM: MRI HEAD WITHOUT CONTRAST TECHNIQUE: Multiplanar, multiecho pulse sequences of the brain and surrounding structures were obtained without intravenous contrast. COMPARISON:  Head CT 08/12/2007 FINDINGS: Brain: The midline structures are normal. There is no acute infarct or acute hemorrhage. No mass lesion, hydrocephalus, dural abnormality or extra-axial collection. Bilateral old cerebellar punctate infarcts. Old left external capsule and thalamus infarcts. There is multifocal periventricular leukoaraiosis, most commonly seen in the setting of chronic ischemic microangiopathy. No age-advanced or lobar predominant atrophy. No chronic microhemorrhage or superficial siderosis. Vascular: Major intracranial arterial and venous sinus flow voids are preserved. Skull and upper cervical spine: The visualized skull base, calvarium, upper cervical spine and extracranial soft tissues are normal. Sinuses/Orbits: No fluid levels or advanced mucosal thickening. No mastoid or middle ear effusion. Normal orbits. IMPRESSION: 1. No acute intracranial abnormality. 2. 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 No other finding to suggest an etiology for the reported vertigo. Electronically Signed   By: Ulyses Jarred M.D.   On: 05/15/2017 05:00    Scheduled Meds: . aspirin EC  81 mg Oral Daily  .  enoxaparin (LOVENOX) injection  40 mg Subcutaneous Daily  . hydrochlorothiazide  25 mg Oral Daily  . losartan  100 mg Oral Daily  . sodium chloride flush  3 mL Intravenous Q12H  . sodium chloride flush  3 mL Intravenous Q12H   Continuous Infusions: . sodium chloride      Principal Problem:   Vertigo Active Problems:   Hypokalemia   EKG abnormalities   Hypertension   Headache    Time spent: >35 minutes     Kinnie Feil  [facility] Pager [phone]. If 7PM-7AM, please contact night-coverage at [email or web address], password [facility] 05/15/2017, 9:11 AM  LOS: 1 day

## 2017-05-15 NOTE — Progress Notes (Signed)
Echocardiogram 2D Echocardiogram/bubble study with contrast has been performed.  Joelene Millin 05/15/2017, 3:33 PM

## 2017-05-15 NOTE — Discharge Summary (Signed)
Physician Discharge Summary  Deanna Sanchez JSE:[id number] DOB: [birth year] DOA: 05/15/2017  PCP: Ronita Hipps, MD  Admit date: 05/15/2017 Discharge date: 05/15/2017  Time spent: >35 minutes  Recommendations for Outpatient Follow-up:  Dr. Terrence Dupont on Monday  PCP in 1-3 days as needed  Discharge Diagnoses:  Principal Problem:   Vertigo Active Problems:   Hypokalemia   EKG abnormalities   Hypertension   Headache   Discharge Condition: stable   Diet recommendation: low sodium   Filed Weights   05/15/17 0126  Weight: 73.3 kg (161 lb 9.6 oz)    History of present illness:   56 y.o.femalewith medical history significant forhypertension, depression, and history of breast cancer status post resection, now presenting to the emergency department with 1 day of vertigo. She reports developing a sensation as though the room was spinning, associated with mild nausea yesterday morning when she got up to use the bathroom. Initially, the symptoms would improve when she laid down and closed her eyes, but have been worsening to the point where the symptoms are more persistent. There is mild nausea associated with this, but no abdominal pain and no vomiting. She denies any chest pain or shortness of breath, though she had a mild dry cough yesterday. She did experience similar symptoms previously that were less severe. Denies headache, change in vision or hearing, or focal numbness or weakness.  [place] Course:Upon arrival to the ED, patient is found to beafebrile, saturating well on room air, and with vitals otherwise stable. EKG features a sinus rhythm with T wave inversion in lead I and aVL. Chest x-rays negative for acute cardiopulmonary disease. Chemistry panel reveals a potassium of 3.3 and CBC is unremarkable. Troponin is undetectable. There was concern that the patient's presenting complaints could represent a form of angina, and she was started on heparin infusion  and nitroglycerin ointment was placed. Cardiology was consulted by the ED physician and hospitalist admission to the telemetry unit at [facility] was arranged.    Hospital Course:   Vertigo. Probable  BPPV. Exam+ dix haulpike (+symptoms and+ nystagmus).  MRI brain: No acute intracranial abnormality. 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 MRA head/neck: No explanation for symptoms.  No vertebrobasilar insufficiency. 1 mm outpouching from the left cavernous ICA, shape favoring infundibulum over aneurysm. Negative for intradural aneurysm. Neck MRA: Negative.  No stenosis or explanation for vertigo. ha1c-5.4. ldl-54. hdl-42. Echo: E 45-50%. No cardiac source of emboli was indentified. -recommended to start aspirin. No new recurrence of vertigo. May need outpatient PT with epley if recurrence   EKG abnormalities. No acute cardiopulmonary symptoms. Trop x2 neg.  Echo. Systolic function was   mildly reduced. The estimated ejection fraction was in the range   of 45% to 50%. There is akinesis of the mid-apicalinferolateral  myocardium.  -patient has no acute cardiopulmonary symptoms. D/w Dr. Terrence Dupont who recommended to f/u with him tomorrow in the office for possible stress test. D/w patient, her family. They will f/u tomorrow   Hypertension. Stable. continue losartan. Hold hctz due to vertigo   Hypokalemia. Serum potassium is 3.3 on admission. Treated with 40 mEq oral potassium  Hx of breast cancer. Pt has hx of right breast cancer s/p resection. continue tamoxifen and outpatient follow-up     Procedures: Echo.Study Conclusions  - Left ventricle: The cavity size was normal. Systolic function was   mildly reduced. The estimated ejection fraction was in the range   of 45% to 50%. There is akinesis of the mid-apicalinferolateral  myocardium. Doppler parameters are consistent with abnormal left   ventricular relaxation (grade 1 diastolic  dysfunction).  Impressions:   - No cardiac source of emboli was indentified.      (i.e. Studies not automatically included, echos, thoracentesis, etc; not x-rays)  Consultations:  Over the phone Dr. Terrence Dupont   Discharge Exam: Vitals:   05/15/17 1116 05/15/17 1300  BP: (!) 153/79 112/63  Pulse:  68  Resp:  16  Temp: 98 F (36.7 C) 98 F (36.7 C)  SpO2: 97% 98%    General: alert. No distress  Cardiovascular: s1,s2 rrr Respiratory: CTA BL  Discharge Instructions  Discharge Instructions    Diet - low sodium heart healthy   Complete by:  As directed    Discharge instructions   Complete by:  As directed    Please follow up with Dr. Terrence Dupont on Monday [phone] [address] [place] [address]   Increase activity slowly   Complete by:  As directed      Allergies as of 05/15/2017      Reactions   Morphine And Related Other (See Comments)   Migraine Headache   Sulfa Antibiotics Hives   Adhesive [tape] Itching, Rash   Tears skin      Medication List    STOP taking these medications   DOCOSAHEXAENOIC ACID PO   losartan-hydrochlorothiazide 100-12.5 MG tablet Commonly known as:  HYZAAR     TAKE these medications   ALPRAZolam 0.25 MG tablet Commonly known as:  XANAX Take 0.25 mg by mouth daily as needed for anxiety.   amitriptyline 25 MG tablet Commonly known as:  ELAVIL Take 50 mg by mouth at bedtime.   aspirin 81 MG EC tablet Take 1 tablet (81 mg total) by mouth daily. Start taking on:  05/16/2017   losartan 100 MG tablet Commonly known as:  COZAAR Take 1 tablet (100 mg total) by mouth daily. Start taking on:  05/16/2017   meclizine 25 MG tablet Commonly known as:  ANTIVERT Take 1 tablet (25 mg total) by mouth 3 (three) times daily as needed for dizziness.   multivitamin tablet Take 1 tablet by mouth daily.   PARoxetine 25 MG 24 hr tablet Commonly known as:  PAXIL-CR Take 50 mg by mouth daily.   tamoxifen 20 MG  tablet Commonly known as:  NOLVADEX Take 20 mg by mouth daily.      Allergies  Allergen Reactions  . Morphine And Related Other (See Comments)    Migraine Headache  . Sulfa Antibiotics Hives  . Adhesive [Tape] Itching and Rash    Tears skin      The results of significant diagnostics from this hospitalization (including imaging, microbiology, ancillary and laboratory) are listed below for reference.    Significant Diagnostic Studies: Mr Virgel Paling OY Contrast  Result Date: 05/15/2017 CLINICAL DATA:  Persistent central vertigo.  History breast cancer. EXAM: MR HEAD WITH CONTRAST MR CIRCLE OF WILLIS WITHOUT CONTRAST MRA OF THE NECK WITHOUT AND WITH CONTRAST TECHNIQUE: Noncontrast intracranial MRA was obtained by time-of-flight technique. Multiplanar, multiecho pulse sequences of the brain were obtained with intravenous contrast. Angiographic images of the neck were obtained using MRA technique without and with intravenous contrast. CONTRAST:  69mL MULTIHANCE GADOBENATE DIMEGLUMINE 529 MG/ML IV SOLN COMPARISON:  Noncontrast brain MRI earlier today. FINDINGS: MR HEAD FINDINGS Brain: No abnormal intracranial enhancement to suggest metastasis or inflammation. No gross labyrinthine or IAC enhancement. Vascular: Major vascular enhancements are preserved.  See below Skull and  upper cervical spine: No evidence of lesion. Sinuses/Orbits: Negative MR CIRCLE OF WILLIS FINDINGS Symmetric vertebral arteries. The left ICA is larger than the right in the setting of fetal type left PCA. No prox stenosis or branch occlusion negative for beading. Asymmetric PCA vessel visualization likely due to asymmetric branching. There is a 1 mm triangular outpouching from the distal left cavernous ICA laterally, morphology more consistent with infundibulum. No intradural aneurysm. MRA NECK FINDINGS Antegrade flow in the bilateral carotid and vertebral arteries on time-of-flight imaging. Normal appearance and diameter of the  arch. Three vessel arch branching. ICA tortuosity with partial retropharyngeal course. Questionable early atherosclerotic irregularity of the medial wall right common carotid bifurcation. No stenosis or beading and anterior circulation. No proximal subclavian stenosis. Apparent narrowing at the left P1 segment is likely from mild kink. No beading under flow limiting stenosis. IMPRESSION: Postcontrast brain MRI: No abnormal enhancement.  No evidence of metastatic disease. Intracranial MRA: 1. No explanation for symptoms.  No vertebrobasilar insufficiency. 2. 1 mm outpouching from the left cavernous ICA, shape favoring infundibulum over aneurysm. Negative for intradural aneurysm. Neck MRA: Negative.  No stenosis or explanation for vertigo. Electronically Signed   By: Monte Fantasia M.D.   On: 05/15/2017 11:57   Mr Jodene Nam Neck W Wo Contrast  Result Date: 05/15/2017 CLINICAL DATA:  Persistent central vertigo.  History breast cancer. EXAM: MR HEAD WITH CONTRAST MR CIRCLE OF WILLIS WITHOUT CONTRAST MRA OF THE NECK WITHOUT AND WITH CONTRAST TECHNIQUE: Noncontrast intracranial MRA was obtained by time-of-flight technique. Multiplanar, multiecho pulse sequences of the brain were obtained with intravenous contrast. Angiographic images of the neck were obtained using MRA technique without and with intravenous contrast. CONTRAST:  72mL MULTIHANCE GADOBENATE DIMEGLUMINE 529 MG/ML IV SOLN COMPARISON:  Noncontrast brain MRI earlier today. FINDINGS: MR HEAD FINDINGS Brain: No abnormal intracranial enhancement to suggest metastasis or inflammation. No gross labyrinthine or IAC enhancement. Vascular: Major vascular enhancements are preserved.  See below Skull and upper cervical spine: No evidence of lesion. Sinuses/Orbits: Negative MR CIRCLE OF WILLIS FINDINGS Symmetric vertebral arteries. The left ICA is larger than the right in the setting of fetal type left PCA. No prox stenosis or branch occlusion negative for beading.  Asymmetric PCA vessel visualization likely due to asymmetric branching. There is a 1 mm triangular outpouching from the distal left cavernous ICA laterally, morphology more consistent with infundibulum. No intradural aneurysm. MRA NECK FINDINGS Antegrade flow in the bilateral carotid and vertebral arteries on time-of-flight imaging. Normal appearance and diameter of the arch. Three vessel arch branching. ICA tortuosity with partial retropharyngeal course. Questionable early atherosclerotic irregularity of the medial wall right common carotid bifurcation. No stenosis or beading and anterior circulation. No proximal subclavian stenosis. Apparent narrowing at the left P1 segment is likely from mild kink. No beading under flow limiting stenosis. IMPRESSION: Postcontrast brain MRI: No abnormal enhancement.  No evidence of metastatic disease. Intracranial MRA: 1. No explanation for symptoms.  No vertebrobasilar insufficiency. 2. 1 mm outpouching from the left cavernous ICA, shape favoring infundibulum over aneurysm. Negative for intradural aneurysm. Neck MRA: Negative.  No stenosis or explanation for vertigo. Electronically Signed   By: Monte Fantasia M.D.   On: 05/15/2017 11:57   Mr Brain Wo Contrast  Result Date: 05/15/2017 CLINICAL DATA:  Vertigo/dizziness.  History of breast cancer. EXAM: MRI HEAD WITHOUT CONTRAST TECHNIQUE: Multiplanar, multiecho pulse sequences of the brain and surrounding structures were obtained without intravenous contrast. COMPARISON:  Head CT 08/12/2007 FINDINGS: Brain: The midline  structures are normal. There is no acute infarct or acute hemorrhage. No mass lesion, hydrocephalus, dural abnormality or extra-axial collection. Bilateral old cerebellar punctate infarcts. Old left external capsule and thalamus infarcts. There is multifocal periventricular leukoaraiosis, most commonly seen in the setting of chronic ischemic microangiopathy. No age-advanced or lobar predominant atrophy. No  chronic microhemorrhage or superficial siderosis. Vascular: Major intracranial arterial and venous sinus flow voids are preserved. Skull and upper cervical spine: The visualized skull base, calvarium, upper cervical spine and extracranial soft tissues are normal. Sinuses/Orbits: No fluid levels or advanced mucosal thickening. No mastoid or middle ear effusion. Normal orbits. IMPRESSION: 1. No acute intracranial abnormality. 2. 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 No other finding to suggest an etiology for the reported vertigo. Electronically Signed   By: Ulyses Jarred M.D.   On: 05/15/2017 05:00   Mr Brain W Contrast  Result Date: 05/15/2017 CLINICAL DATA:  Persistent central vertigo.  History breast cancer. EXAM: MR HEAD WITH CONTRAST MR CIRCLE OF WILLIS WITHOUT CONTRAST MRA OF THE NECK WITHOUT AND WITH CONTRAST TECHNIQUE: Noncontrast intracranial MRA was obtained by time-of-flight technique. Multiplanar, multiecho pulse sequences of the brain were obtained with intravenous contrast. Angiographic images of the neck were obtained using MRA technique without and with intravenous contrast. CONTRAST:  71mL MULTIHANCE GADOBENATE DIMEGLUMINE 529 MG/ML IV SOLN COMPARISON:  Noncontrast brain MRI earlier today. FINDINGS: MR HEAD FINDINGS Brain: No abnormal intracranial enhancement to suggest metastasis or inflammation. No gross labyrinthine or IAC enhancement. Vascular: Major vascular enhancements are preserved.  See below Skull and upper cervical spine: No evidence of lesion. Sinuses/Orbits: Negative MR CIRCLE OF WILLIS FINDINGS Symmetric vertebral arteries. The left ICA is larger than the right in the setting of fetal type left PCA. No prox stenosis or branch occlusion negative for beading. Asymmetric PCA vessel visualization likely due to asymmetric branching. There is a 1 mm triangular outpouching from the distal left cavernous ICA laterally, morphology more consistent with infundibulum. No intradural  aneurysm. MRA NECK FINDINGS Antegrade flow in the bilateral carotid and vertebral arteries on time-of-flight imaging. Normal appearance and diameter of the arch. Three vessel arch branching. ICA tortuosity with partial retropharyngeal course. Questionable early atherosclerotic irregularity of the medial wall right common carotid bifurcation. No stenosis or beading and anterior circulation. No proximal subclavian stenosis. Apparent narrowing at the left P1 segment is likely from mild kink. No beading under flow limiting stenosis. IMPRESSION: Postcontrast brain MRI: No abnormal enhancement.  No evidence of metastatic disease. Intracranial MRA: 1. No explanation for symptoms.  No vertebrobasilar insufficiency. 2. 1 mm outpouching from the left cavernous ICA, shape favoring infundibulum over aneurysm. Negative for intradural aneurysm. Neck MRA: Negative.  No stenosis or explanation for vertigo. Electronically Signed   By: Monte Fantasia M.D.   On: 05/15/2017 11:57    Microbiology: No results found for this or any previous visit (from the past 240 hour(s)).   Labs: Basic Metabolic Panel: Recent Labs  Lab 05/15/17 0213  NA 137  K 3.0*  CL 106  CO2 25  GLUCOSE 96  BUN 13  CREATININE 0.70  CALCIUM 8.5*  MG 2.0   Liver Function Tests: Recent Labs  Lab 05/15/17 0213  AST 18  ALT 19  ALKPHOS 49  BILITOT 0.8  PROT 6.0*  ALBUMIN 3.3*   No results for input(s): LIPASE, AMYLASE in the last 168 hours. No results for input(s): AMMONIA in the last 168 hours. CBC: Recent Labs  Lab 05/15/17 0213  WBC 7.8  HGB  12.8  HCT 37.7  MCV 90.0  PLT 162   Cardiac Enzymes: Recent Labs  Lab 05/15/17 0213 05/15/17 0725 05/15/17 1233  TROPONINI <0.03 <0.03 <0.03   BNP: BNP (last 3 results) Recent Labs    05/15/17 0213  BNP 28.6    ProBNP (last 3 results) No results for input(s): PROBNP in the last 8760 hours.  CBG: No results for input(s): GLUCAP in the last 168  hours.     SignedKinnie Feil  [facility] 05/15/2017, 4:31 PM

## 2017-05-15 NOTE — Plan of Care (Signed)
Transfer from [facility] discussed with Dr. Norm Salt.  Ms. Deanna Sanchez is a 56 year old female who presents with malaise and fatigue.  Vital signs noted to be stable.  Labs revealed WBC 7, hemoglobin 13, platelets 162, sodium 135, potassium 3.3, chloride 105, CO2 25, BUN 14, creatinine 0.5, glucose 90, LFTs relatively within normal limits, and initial troponin undetectable.  EKG showing multiple abnormalities including T wave inversion and subtle ST wave elevation not meeting STEMI criteria. Transfer was requested for likely need of cardiology consultative services.  Dr. Terrence Dupont of cardiology was consulted.  Accepted to a telemetry bed.

## 2017-05-18 ENCOUNTER — Other Ambulatory Visit: Payer: Self-pay | Admitting: Cardiology

## 2017-05-18 DIAGNOSIS — G43909 Migraine, unspecified, not intractable, without status migrainosus: Secondary | ICD-10-CM | POA: Diagnosis not present

## 2017-05-18 DIAGNOSIS — E785 Hyperlipidemia, unspecified: Secondary | ICD-10-CM | POA: Diagnosis not present

## 2017-05-18 DIAGNOSIS — R0609 Other forms of dyspnea: Secondary | ICD-10-CM | POA: Diagnosis not present

## 2017-05-18 DIAGNOSIS — F419 Anxiety disorder, unspecified: Secondary | ICD-10-CM | POA: Diagnosis not present

## 2017-05-18 DIAGNOSIS — R079 Chest pain, unspecified: Secondary | ICD-10-CM

## 2017-05-19 DIAGNOSIS — G43101 Migraine with aura, not intractable, with status migrainosus: Secondary | ICD-10-CM | POA: Diagnosis not present

## 2017-05-19 DIAGNOSIS — F411 Generalized anxiety disorder: Secondary | ICD-10-CM | POA: Diagnosis not present

## 2017-05-30 ENCOUNTER — Encounter (HOSPITAL_COMMUNITY)
Admission: RE | Admit: 2017-05-30 | Discharge: 2017-05-30 | Disposition: A | Discharge status: Home / Self Care | Payer: BLUE CROSS/BLUE SHIELD | Source: Ambulatory Visit | Attending: Cardiology | Admitting: Cardiology

## 2017-05-30 DIAGNOSIS — Z79810 Long term (current) use of selective estrogen receptor modulators (SERMs): Secondary | ICD-10-CM | POA: Diagnosis not present

## 2017-05-30 DIAGNOSIS — Z170 Estrogen receptor positive status [ER+]: Secondary | ICD-10-CM | POA: Diagnosis not present

## 2017-05-30 DIAGNOSIS — E785 Hyperlipidemia, unspecified: Secondary | ICD-10-CM | POA: Diagnosis not present

## 2017-05-30 DIAGNOSIS — Z923 Personal history of irradiation: Secondary | ICD-10-CM | POA: Diagnosis not present

## 2017-05-30 DIAGNOSIS — R0609 Other forms of dyspnea: Secondary | ICD-10-CM | POA: Diagnosis not present

## 2017-05-30 DIAGNOSIS — I208 Other forms of angina pectoris: Secondary | ICD-10-CM | POA: Diagnosis not present

## 2017-05-30 DIAGNOSIS — R9431 Abnormal electrocardiogram [ECG] [EKG]: Secondary | ICD-10-CM | POA: Diagnosis not present

## 2017-05-30 DIAGNOSIS — R079 Chest pain, unspecified: Secondary | ICD-10-CM | POA: Insufficient documentation

## 2017-05-30 DIAGNOSIS — C50911 Malignant neoplasm of unspecified site of right female breast: Secondary | ICD-10-CM | POA: Diagnosis not present

## 2017-05-30 DIAGNOSIS — Z853 Personal history of malignant neoplasm of breast: Secondary | ICD-10-CM | POA: Diagnosis not present

## 2017-05-30 MED ORDER — TECHNETIUM TC 99M TETROFOSMIN IV KIT
30.0000 | PACK | Freq: Once | INTRAVENOUS | Status: AC | PRN
  Administered 2017-05-30: 30 via INTRAVENOUS

## 2017-05-30 MED ORDER — REGADENOSON 0.4 MG/5ML IV SOLN
INTRAVENOUS | Status: AC
  Filled 2017-05-30: qty 5

## 2017-05-30 MED ORDER — TECHNETIUM TC 99M TETROFOSMIN IV KIT
10.0000 | PACK | Freq: Once | INTRAVENOUS | Status: AC | PRN
  Administered 2017-05-30: 10 via INTRAVENOUS

## 2017-05-30 MED ORDER — REGADENOSON 0.4 MG/5ML IV SOLN
0.4000 mg | Freq: Once | INTRAVENOUS | Status: AC
  Administered 2017-05-30: 0.4 mg via INTRAVENOUS

## 2017-05-31 DIAGNOSIS — H524 Presbyopia: Secondary | ICD-10-CM | POA: Diagnosis not present

## 2017-07-21 DIAGNOSIS — I10 Essential (primary) hypertension: Secondary | ICD-10-CM | POA: Diagnosis not present

## 2017-07-21 DIAGNOSIS — E785 Hyperlipidemia, unspecified: Secondary | ICD-10-CM | POA: Diagnosis not present

## 2017-07-21 DIAGNOSIS — I208 Other forms of angina pectoris: Secondary | ICD-10-CM | POA: Diagnosis not present

## 2017-07-21 DIAGNOSIS — F419 Anxiety disorder, unspecified: Secondary | ICD-10-CM | POA: Diagnosis not present

## 2017-07-27 DIAGNOSIS — Z6832 Body mass index (BMI) 32.0-32.9, adult: Secondary | ICD-10-CM | POA: Diagnosis not present

## 2017-07-27 DIAGNOSIS — J4521 Mild intermittent asthma with (acute) exacerbation: Secondary | ICD-10-CM | POA: Diagnosis not present

## 2017-07-27 DIAGNOSIS — R509 Fever, unspecified: Secondary | ICD-10-CM | POA: Diagnosis not present

## 2017-07-27 DIAGNOSIS — H6983 Other specified disorders of Eustachian tube, bilateral: Secondary | ICD-10-CM | POA: Diagnosis not present

## 2018-02-01 DIAGNOSIS — I208 Other forms of angina pectoris: Secondary | ICD-10-CM | POA: Diagnosis not present

## 2018-02-01 DIAGNOSIS — F419 Anxiety disorder, unspecified: Secondary | ICD-10-CM | POA: Diagnosis not present

## 2018-02-01 DIAGNOSIS — I10 Essential (primary) hypertension: Secondary | ICD-10-CM | POA: Diagnosis not present

## 2018-02-01 DIAGNOSIS — E785 Hyperlipidemia, unspecified: Secondary | ICD-10-CM | POA: Diagnosis not present

## 2018-04-18 DIAGNOSIS — I10 Essential (primary) hypertension: Secondary | ICD-10-CM | POA: Diagnosis not present

## 2018-04-18 DIAGNOSIS — E785 Hyperlipidemia, unspecified: Secondary | ICD-10-CM | POA: Diagnosis not present

## 2018-04-19 DIAGNOSIS — R7301 Impaired fasting glucose: Secondary | ICD-10-CM | POA: Diagnosis not present

## 2018-04-26 DIAGNOSIS — C50411 Malignant neoplasm of upper-outer quadrant of right female breast: Secondary | ICD-10-CM | POA: Diagnosis not present

## 2018-04-26 DIAGNOSIS — Z853 Personal history of malignant neoplasm of breast: Secondary | ICD-10-CM | POA: Diagnosis not present

## 2018-04-27 DIAGNOSIS — Z923 Personal history of irradiation: Secondary | ICD-10-CM | POA: Diagnosis not present

## 2018-04-27 DIAGNOSIS — Z79810 Long term (current) use of selective estrogen receptor modulators (SERMs): Secondary | ICD-10-CM | POA: Diagnosis not present

## 2018-04-27 DIAGNOSIS — Z170 Estrogen receptor positive status [ER+]: Secondary | ICD-10-CM | POA: Diagnosis not present

## 2018-04-27 DIAGNOSIS — Z853 Personal history of malignant neoplasm of breast: Secondary | ICD-10-CM | POA: Diagnosis not present

## 2018-04-27 DIAGNOSIS — C50911 Malignant neoplasm of unspecified site of right female breast: Secondary | ICD-10-CM | POA: Diagnosis not present

## 2018-06-09 DIAGNOSIS — I10 Essential (primary) hypertension: Secondary | ICD-10-CM | POA: Diagnosis not present

## 2018-06-09 DIAGNOSIS — F419 Anxiety disorder, unspecified: Secondary | ICD-10-CM | POA: Diagnosis not present

## 2018-06-09 DIAGNOSIS — I208 Other forms of angina pectoris: Secondary | ICD-10-CM | POA: Diagnosis not present

## 2018-06-09 DIAGNOSIS — E785 Hyperlipidemia, unspecified: Secondary | ICD-10-CM | POA: Diagnosis not present

## 2018-09-19 IMAGING — MR MR HEAD W/O CM
9 of 10 series · 34 of 48 positions shown · non-contrast
Comparison: Head CT 08/12/2007

CLINICAL DATA: Vertigo/dizziness.  History of breast cancer.

EXAM:
MRI HEAD WITHOUT CONTRAST
TECHNIQUE: Multiplanar, multiecho pulse sequences of the brain and surrounding
structures were obtained without intravenous contrast.

[Series 4: DWI · axial · 3.0mm · 0.94mm/px · z∈[+59,+206]mm · 8 of 100 slices shown (1 of 2)]
[im 1/100]
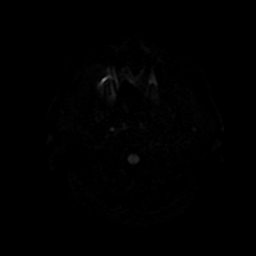
[im 12/100]
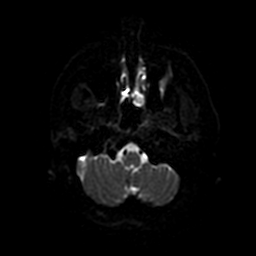
[im 34/100]
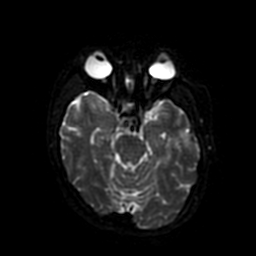
[im 45/100]
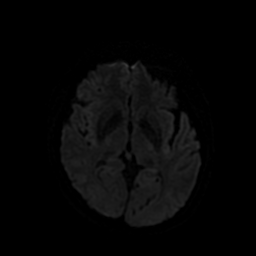
[im 56/100]
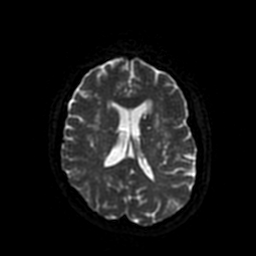
[im 67/100]
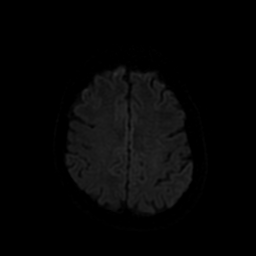
[im 89/100]
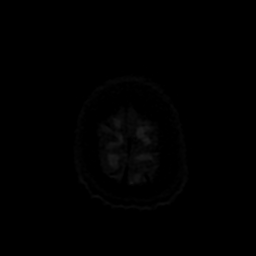
[im 100/100]
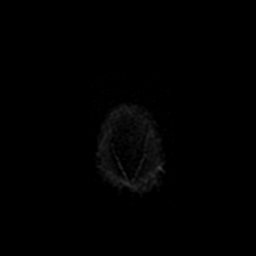

[Series 5: FLAIR · sagittal · 5.0mm · 0.47mm/px · 2 of 23 slices shown (1 of 2)]
[im 1/23]
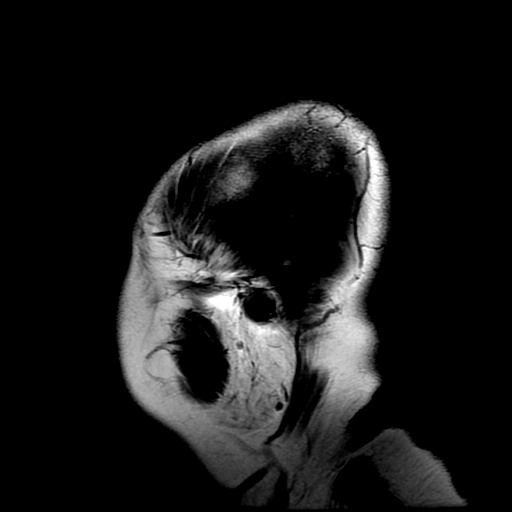
[im 23/23]
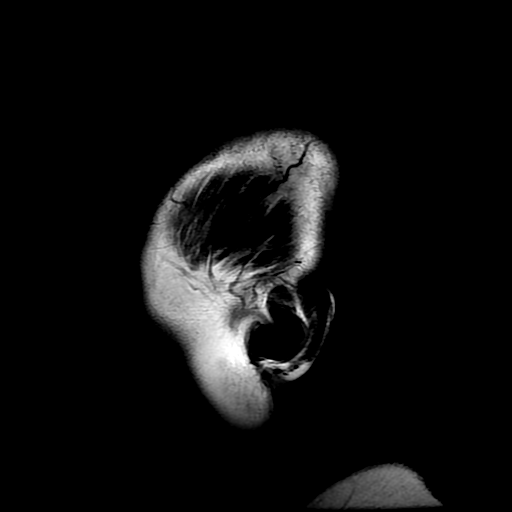

[Series 6: DWI · coronal · 4.0mm · 0.94mm/px · 6 of 68 slices shown (2 of 2)]
[im 1/68]
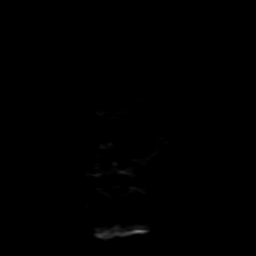
[im 14/68]
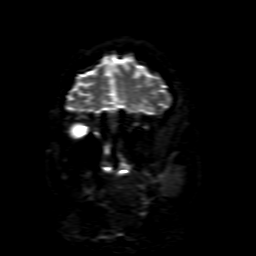
[im 27/68]
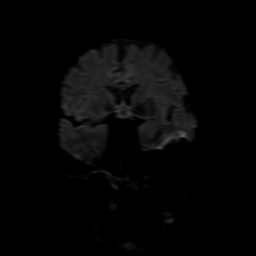
[im 41/68]
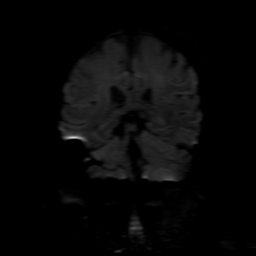
[im 54/68]
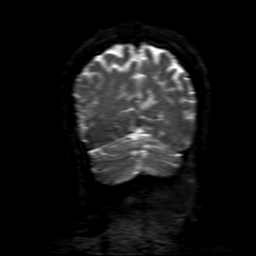
[im 68/68]
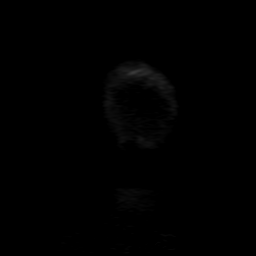

[Series 7: T2 · axial · 5.0mm · 0.43mm/px · z∈[+59,+209]mm · 2 of 26 slices shown (1 of 2)]
[im 1/26]
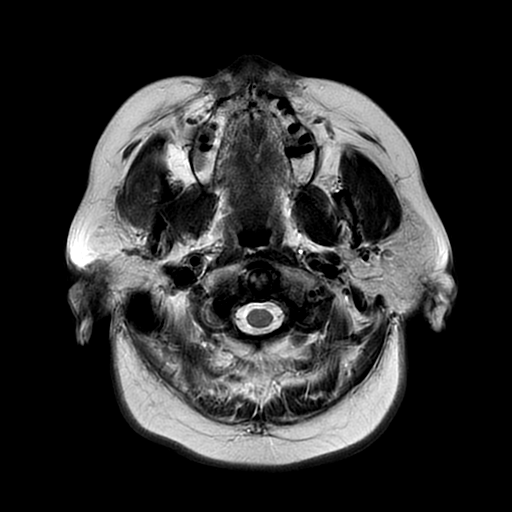
[im 26/26]
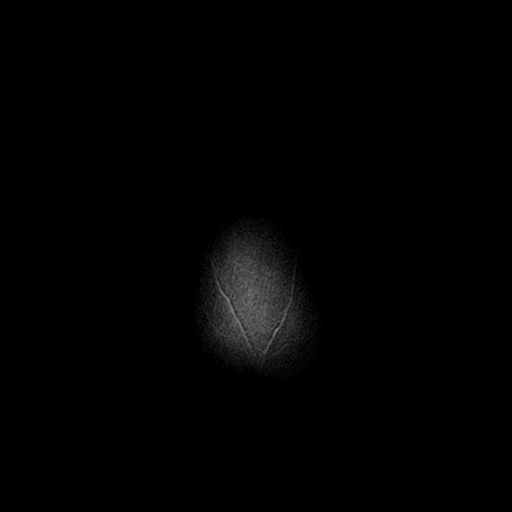

[Series 8: FLAIR · axial · 3.0mm · 0.43mm/px · z∈[+59,+209]mm · 2 of 26 slices shown (2 of 2)]
[im 1/26]
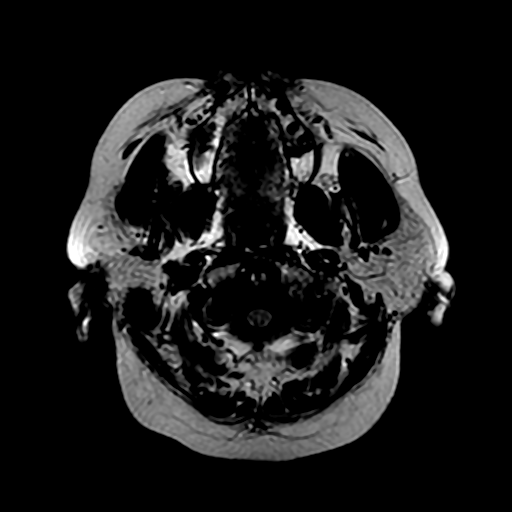
[im 26/26]
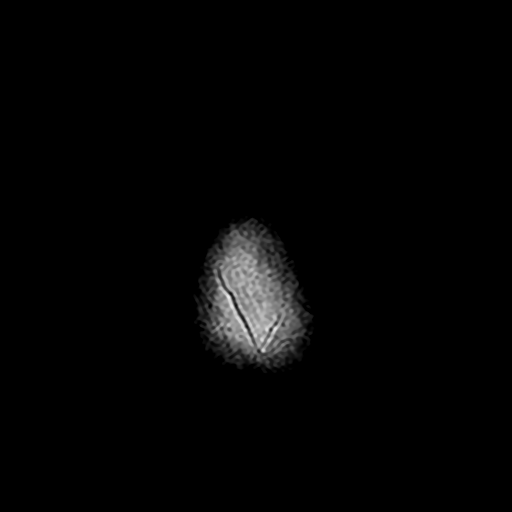

[Series 9: (person_name) · axial · 3.0mm · 0.47mm/px · z∈[+57,+108]mm · 3 of 104 slices shown]
[im 1/104]
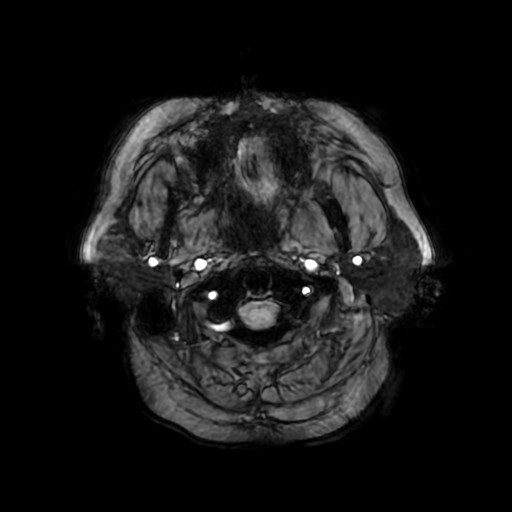
[im 12/104]
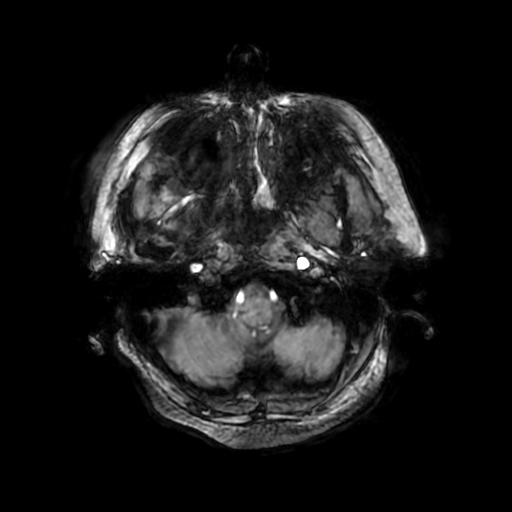
[im 35/104]
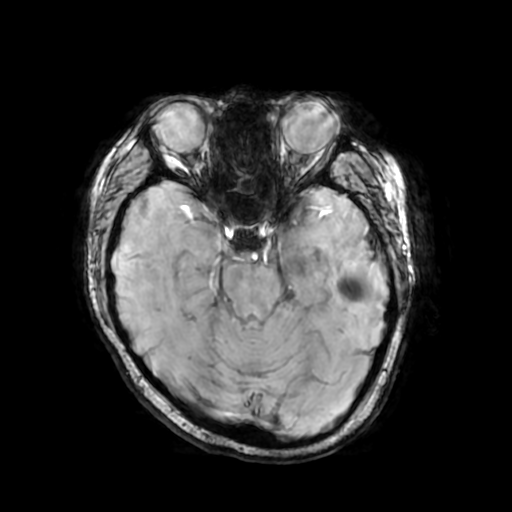

[Series 11: T2 · coronal · 5.0mm · 0.39mm/px · 3 of 28 slices shown (2 of 2)]
[im 1/28]
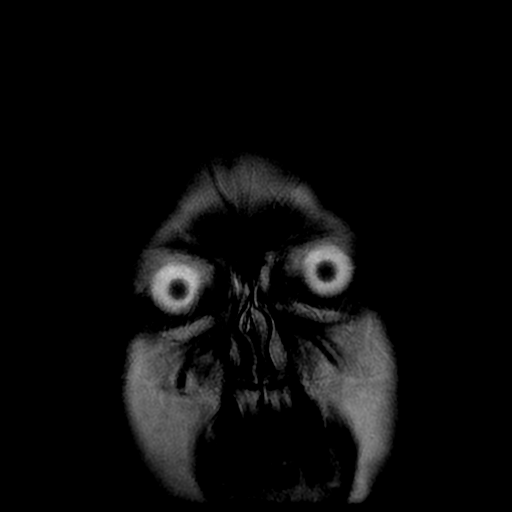
[im 14/28]
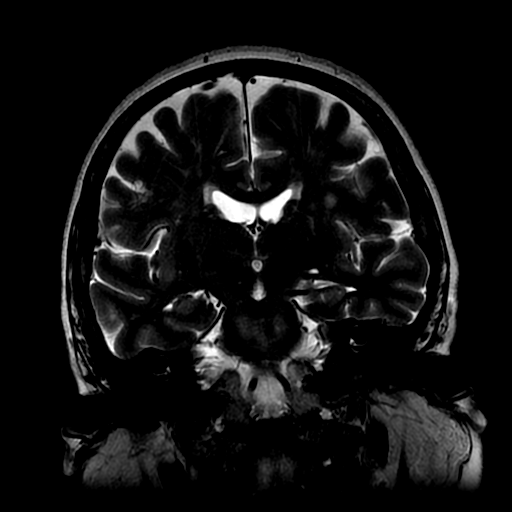
[im 28/28]
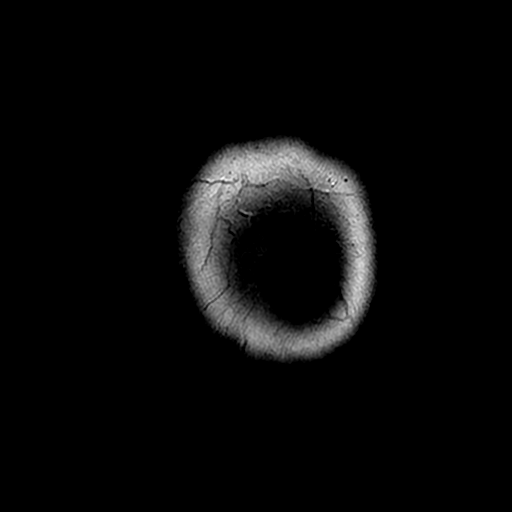

[Series 450: ADC · axial · 3.0mm · 0.94mm/px · z∈[+59,+206]mm · 5 of 50 slices shown (1 of 2)]
[im 1/50]
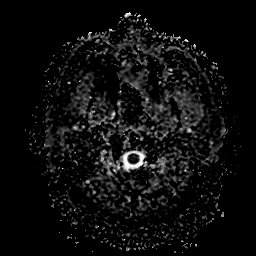
[im 13/50]
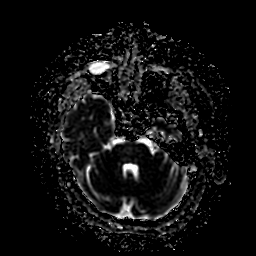
[im 25/50]
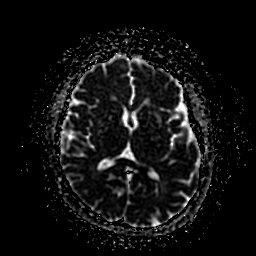
[im 37/50]
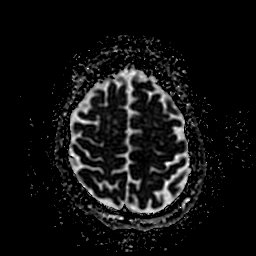
[im 50/50]
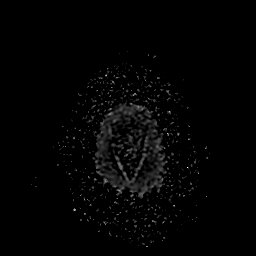

[Series 650: ADC · coronal · 4.0mm · 0.94mm/px · 3 of 34 slices shown (2 of 2)]
[im 1/34]
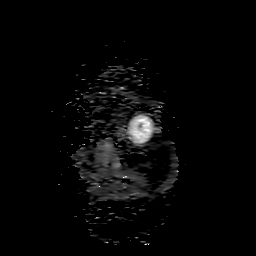
[im 17/34]
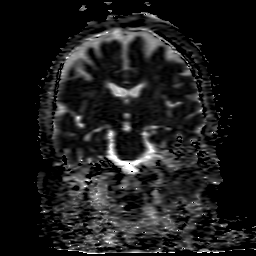
[im 34/34]
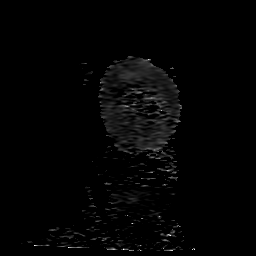

[34 of 48 positions shown; findings below may reference images not displayed]

FINDINGS: Brain: The midline structures are normal. There is no acute infarct
or acute hemorrhage. No mass lesion, hydrocephalus, dural
abnormality or extra-axial collection. Bilateral old cerebellar
punctate infarcts. Old left external capsule and thalamus infarcts.
There is multifocal periventricular leukoaraiosis, most commonly
seen in the setting of chronic ischemic microangiopathy. No
age-advanced or lobar predominant atrophy. No chronic
microhemorrhage or superficial siderosis.

Vascular: Major intracranial arterial and venous sinus flow voids
are preserved.

Skull and upper cervical spine: The visualized skull base,
calvarium, upper cervical spine and extracranial soft tissues are
normal.

Sinuses/Orbits: No fluid levels or advanced mucosal thickening. No
mastoid or middle ear effusion. Normal orbits.
IMPRESSION: 1. No acute intracranial abnormality.
2. Bilateral old subcentimeter cerebellar infarcts. No other finding
to suggest an etiology for the reported vertigo.

## 2020-02-05 DIAGNOSIS — S29012A Strain of muscle and tendon of back wall of thorax, initial encounter: Secondary | ICD-10-CM | POA: Diagnosis not present

## 2020-02-05 DIAGNOSIS — S39012A Strain of muscle, fascia and tendon of lower back, initial encounter: Secondary | ICD-10-CM | POA: Diagnosis not present

## 2020-02-15 DIAGNOSIS — L578 Other skin changes due to chronic exposure to nonionizing radiation: Secondary | ICD-10-CM | POA: Diagnosis not present

## 2020-02-15 DIAGNOSIS — L820 Inflamed seborrheic keratosis: Secondary | ICD-10-CM | POA: Diagnosis not present

## 2020-02-15 DIAGNOSIS — L821 Other seborrheic keratosis: Secondary | ICD-10-CM | POA: Diagnosis not present

## 2020-03-05 DIAGNOSIS — I10 Essential (primary) hypertension: Secondary | ICD-10-CM | POA: Diagnosis not present

## 2020-03-05 DIAGNOSIS — Z23 Encounter for immunization: Secondary | ICD-10-CM | POA: Diagnosis not present

## 2020-03-05 DIAGNOSIS — E559 Vitamin D deficiency, unspecified: Secondary | ICD-10-CM | POA: Diagnosis not present

## 2020-03-05 DIAGNOSIS — M5412 Radiculopathy, cervical region: Secondary | ICD-10-CM | POA: Diagnosis not present

## 2020-03-05 DIAGNOSIS — Z6832 Body mass index (BMI) 32.0-32.9, adult: Secondary | ICD-10-CM | POA: Diagnosis not present

## 2020-04-11 DIAGNOSIS — E559 Vitamin D deficiency, unspecified: Secondary | ICD-10-CM | POA: Diagnosis not present

## 2020-04-11 DIAGNOSIS — E785 Hyperlipidemia, unspecified: Secondary | ICD-10-CM | POA: Diagnosis not present

## 2020-04-11 DIAGNOSIS — I10 Essential (primary) hypertension: Secondary | ICD-10-CM | POA: Diagnosis not present

## 2020-04-11 DIAGNOSIS — I208 Other forms of angina pectoris: Secondary | ICD-10-CM | POA: Diagnosis not present

## 2020-06-05 DIAGNOSIS — K529 Noninfective gastroenteritis and colitis, unspecified: Secondary | ICD-10-CM | POA: Diagnosis not present

## 2020-06-05 DIAGNOSIS — Z6833 Body mass index (BMI) 33.0-33.9, adult: Secondary | ICD-10-CM | POA: Diagnosis not present

## 2020-06-05 DIAGNOSIS — R152 Fecal urgency: Secondary | ICD-10-CM | POA: Diagnosis not present

## 2020-06-06 DIAGNOSIS — K529 Noninfective gastroenteritis and colitis, unspecified: Secondary | ICD-10-CM | POA: Diagnosis not present

## 2020-06-16 DIAGNOSIS — J329 Chronic sinusitis, unspecified: Secondary | ICD-10-CM | POA: Diagnosis not present

## 2020-06-16 DIAGNOSIS — Z20828 Contact with and (suspected) exposure to other viral communicable diseases: Secondary | ICD-10-CM | POA: Diagnosis not present

## 2020-06-16 DIAGNOSIS — J40 Bronchitis, not specified as acute or chronic: Secondary | ICD-10-CM | POA: Diagnosis not present

## 2020-06-27 DIAGNOSIS — Z20822 Contact with and (suspected) exposure to covid-19: Secondary | ICD-10-CM | POA: Diagnosis not present

## 2020-07-15 DIAGNOSIS — F411 Generalized anxiety disorder: Secondary | ICD-10-CM | POA: Diagnosis not present

## 2020-07-15 DIAGNOSIS — G43101 Migraine with aura, not intractable, with status migrainosus: Secondary | ICD-10-CM | POA: Diagnosis not present

## 2020-07-15 DIAGNOSIS — G479 Sleep disorder, unspecified: Secondary | ICD-10-CM | POA: Diagnosis not present

## 2020-08-29 DIAGNOSIS — I6381 Other cerebral infarction due to occlusion or stenosis of small artery: Secondary | ICD-10-CM | POA: Diagnosis not present

## 2020-08-29 DIAGNOSIS — I208 Other forms of angina pectoris: Secondary | ICD-10-CM | POA: Diagnosis not present

## 2020-08-29 DIAGNOSIS — E785 Hyperlipidemia, unspecified: Secondary | ICD-10-CM | POA: Diagnosis not present

## 2020-08-29 DIAGNOSIS — I10 Essential (primary) hypertension: Secondary | ICD-10-CM | POA: Diagnosis not present

## 2021-01-05 DIAGNOSIS — I208 Other forms of angina pectoris: Secondary | ICD-10-CM | POA: Diagnosis not present

## 2021-01-05 DIAGNOSIS — I10 Essential (primary) hypertension: Secondary | ICD-10-CM | POA: Diagnosis not present

## 2021-01-05 DIAGNOSIS — F419 Anxiety disorder, unspecified: Secondary | ICD-10-CM | POA: Diagnosis not present

## 2021-01-05 DIAGNOSIS — E785 Hyperlipidemia, unspecified: Secondary | ICD-10-CM | POA: Diagnosis not present

## 2021-04-10 DIAGNOSIS — E785 Hyperlipidemia, unspecified: Secondary | ICD-10-CM | POA: Diagnosis not present

## 2021-04-10 DIAGNOSIS — I10 Essential (primary) hypertension: Secondary | ICD-10-CM | POA: Diagnosis not present

## 2021-04-10 DIAGNOSIS — I208 Other forms of angina pectoris: Secondary | ICD-10-CM | POA: Diagnosis not present

## 2021-05-28 DIAGNOSIS — K582 Mixed irritable bowel syndrome: Secondary | ICD-10-CM | POA: Diagnosis not present

## 2021-05-28 DIAGNOSIS — I10 Essential (primary) hypertension: Secondary | ICD-10-CM | POA: Diagnosis not present

## 2021-05-28 DIAGNOSIS — I252 Old myocardial infarction: Secondary | ICD-10-CM | POA: Diagnosis not present

## 2021-05-28 DIAGNOSIS — I6381 Other cerebral infarction due to occlusion or stenosis of small artery: Secondary | ICD-10-CM | POA: Diagnosis not present

## 2021-05-28 DIAGNOSIS — E559 Vitamin D deficiency, unspecified: Secondary | ICD-10-CM | POA: Diagnosis not present

## 2021-07-14 DIAGNOSIS — G43109 Migraine with aura, not intractable, without status migrainosus: Secondary | ICD-10-CM | POA: Diagnosis not present

## 2021-07-14 DIAGNOSIS — I10 Essential (primary) hypertension: Secondary | ICD-10-CM | POA: Diagnosis not present

## 2021-07-14 DIAGNOSIS — E785 Hyperlipidemia, unspecified: Secondary | ICD-10-CM | POA: Diagnosis not present

## 2021-07-14 DIAGNOSIS — I6381 Other cerebral infarction due to occlusion or stenosis of small artery: Secondary | ICD-10-CM | POA: Diagnosis not present

## 2021-07-14 DIAGNOSIS — F411 Generalized anxiety disorder: Secondary | ICD-10-CM | POA: Diagnosis not present

## 2021-07-14 DIAGNOSIS — G479 Sleep disorder, unspecified: Secondary | ICD-10-CM | POA: Diagnosis not present

## 2021-07-14 DIAGNOSIS — I208 Other forms of angina pectoris: Secondary | ICD-10-CM | POA: Diagnosis not present

## 2021-10-23 DIAGNOSIS — E785 Hyperlipidemia, unspecified: Secondary | ICD-10-CM | POA: Diagnosis not present

## 2021-10-23 DIAGNOSIS — I208 Other forms of angina pectoris: Secondary | ICD-10-CM | POA: Diagnosis not present

## 2021-10-23 DIAGNOSIS — I10 Essential (primary) hypertension: Secondary | ICD-10-CM | POA: Diagnosis not present

## 2021-10-23 DIAGNOSIS — E559 Vitamin D deficiency, unspecified: Secondary | ICD-10-CM | POA: Diagnosis not present

## 2022-01-05 DIAGNOSIS — H66002 Acute suppurative otitis media without spontaneous rupture of ear drum, left ear: Secondary | ICD-10-CM | POA: Diagnosis not present

## 2022-06-03 DIAGNOSIS — E785 Hyperlipidemia, unspecified: Secondary | ICD-10-CM | POA: Diagnosis not present

## 2022-06-03 DIAGNOSIS — E559 Vitamin D deficiency, unspecified: Secondary | ICD-10-CM | POA: Diagnosis not present

## 2022-06-03 DIAGNOSIS — I10 Essential (primary) hypertension: Secondary | ICD-10-CM | POA: Diagnosis not present

## 2022-06-24 ENCOUNTER — Encounter: Payer: Self-pay | Admitting: Emergency Medicine

## 2022-06-24 ENCOUNTER — Ambulatory Visit
Admission: EM | Admit: 2022-06-24 | Discharge: 2022-06-24 | Disposition: A | Discharge status: Home / Self Care | Payer: BC Managed Care – PPO | Attending: Physician Assistant | Admitting: Physician Assistant

## 2022-06-24 DIAGNOSIS — J069 Acute upper respiratory infection, unspecified: Secondary | ICD-10-CM | POA: Diagnosis not present

## 2022-06-24 DIAGNOSIS — J209 Acute bronchitis, unspecified: Secondary | ICD-10-CM

## 2022-06-24 MED ORDER — SPACER/AERO-HOLDING CHAMBERS DEVI: 1.0000 [IU] | Freq: Three times a day (TID) | 0 refills | Status: AC

## 2022-06-24 MED ORDER — ALBUTEROL SULFATE HFA 108 (90 BASE) MCG/ACT IN AERS: 2.0000 | INHALATION_SPRAY | Freq: Four times a day (QID) | RESPIRATORY_TRACT | 0 refills | Status: AC | PRN

## 2022-06-24 MED ORDER — DM-GUAIFENESIN ER 30-600 MG PO TB12: 1.0000 | ORAL_TABLET | Freq: Two times a day (BID) | ORAL | 0 refills | Status: DC

## 2022-06-24 NOTE — ED Provider Notes (Signed)
[facility]    CSN: [id number] Arrival date & time: 06/24/22  7622      History   Chief Complaint Chief Complaint  Patient presents with   Cough    HPI Deanna Sanchez is a 62 y.o. female.   62 year old female presents with cough and congestion.  Patient indicates for the past 1 to 2 days she has had some mild upper respiratory congestion with postnasal drip and intermittent laryngitis.  Patient indicates she is also had some mild chest congestion with cough production being clear.  She indicates she has had mild wheezing and shortness of breath with the coughing episodes.  She indicates she has not had fever, chills, body aches or pain.  She indicates she has not been taking any OTC medicines for her symptoms.  She indicates that she works at [employer] and has around customers on a regular basis.  She is currently tolerating fluids well.  She indicates she is a non-smoker.   Cough Associated symptoms: rhinorrhea and wheezing     Past Medical History:  Diagnosis Date   Cancer [facility])    Headache    Hypertension     Patient Active Problem List   Diagnosis Date Noted   Hypokalemia 05/15/2017   EKG abnormalities 05/15/2017   Hypertension 05/15/2017   Vertigo 05/15/2017   Headache 05/15/2017    History reviewed. No pertinent surgical history.  OB History   No obstetric history on file.      Home Medications    Prior to Admission medications   Medication Sig Start Date End Date Taking? Authorizing Provider  albuterol (VENTOLIN HFA) 108 (90 Base) MCG/ACT inhaler Inhale 2 puffs into the lungs every 6 (six) hours as needed for wheezing or shortness of breath. 06/24/22  Yes Nyoka Lint, PA-C  ALPRAZolam Duanne Moron) 0.25 MG tablet Take 0.25 mg by mouth daily as needed for anxiety.   Yes [provider]  amitriptyline (ELAVIL) 25 MG tablet Take 50 mg by mouth at bedtime. 03/28/17  Yes [provider]  aspirin EC 81 MG EC tablet Take 1 tablet  (81 mg total) by mouth daily. 05/16/17  Yes Buriev, Arie Sabina, MD  dextromethorphan-guaiFENesin (MUCINEX DM) 30-600 MG 12hr tablet Take 1 tablet by mouth 2 (two) times daily. 06/24/22  Yes Nyoka Lint, PA-C  losartan (COZAAR) 100 MG tablet Take 1 tablet (100 mg total) by mouth daily. 05/16/17  Yes Buriev, Arie Sabina, MD  meclizine (ANTIVERT) 25 MG tablet Take 1 tablet (25 mg total) by mouth 3 (three) times daily as needed for dizziness. 05/15/17  Yes Kinnie Feil, MD  Multiple Vitamin (MULTIVITAMIN) tablet Take 1 tablet by mouth daily.   Yes [provider]  PARoxetine (PAXIL-CR) 25 MG 24 hr tablet Take 50 mg by mouth daily. 05/15/17  Yes [provider]  Spacer/Aero-Holding Chambers DEVI 1 Units by Does not apply route in the morning, at noon, and at bedtime. 06/24/22  Yes Nyoka Lint, PA-C  tamoxifen (NOLVADEX) 20 MG tablet Take 20 mg by mouth daily. 03/23/17  Yes [provider]    Family History Family History  Problem Relation Age of Onset   Coronary artery disease Neg Hx     Social History Social History   Tobacco Use   Smoking status: Never   Smokeless tobacco: Never  Vaping Use   Vaping Use: Never used  Substance Use Topics   Alcohol use: No   Drug use: No     Allergies  Morphine and related, Sulfa antibiotics, and Adhesive [tape]   Review of Systems Review of Systems  HENT:  Positive for postnasal drip and rhinorrhea.   Respiratory:  Positive for cough and wheezing.      Physical Exam Triage Vital Signs ED Triage Vitals  Enc Vitals Group     BP 06/24/22 1107 138/69     Pulse Rate 06/24/22 1107 84     Resp 06/24/22 1107 18     Temp 06/24/22 1107 98.3 F (36.8 C)     Temp Source 06/24/22 1107 Oral     SpO2 06/24/22 1107 93 %     Weight 06/24/22 1108 155 lb (70.3 kg)     Height 06/24/22 1108 '5\' 1"'$  (1.549 m)     Head Circumference --      Peak Flow --      Pain Score 06/24/22 1108 6     Pain Loc --      Pain Edu? --       Excl. in [place]? --    No data found.  Updated Vital Signs BP 138/69 (BP Location: Left Arm)   Pulse 84   Temp 98.3 F (36.8 C) (Oral)   Resp 18   Ht '5\' 1"'$  (1.549 m)   Wt 155 lb (70.3 kg)   SpO2 93%   BMI 29.29 kg/m   Visual Acuity Right Eye Distance:   Left Eye Distance:   Bilateral Distance:    Right Eye Near:   Left Eye Near:    Bilateral Near:     Physical Exam Constitutional:      Appearance: Normal appearance.  HENT:     Right Ear: Tympanic membrane and ear canal normal.     Left Ear: Tympanic membrane and ear canal normal.     Mouth/Throat:     Mouth: Mucous membranes are moist.     Pharynx: Oropharynx is clear.  Cardiovascular:     Rate and Rhythm: Normal rate and regular rhythm.     Heart sounds: Normal heart sounds.  Pulmonary:     Effort: Pulmonary effort is normal.     Breath sounds: Normal breath sounds and air entry. No wheezing, rhonchi or rales.  Lymphadenopathy:     Cervical: No cervical adenopathy.  Neurological:     Mental Status: She is alert.      UC Treatments / Results  Labs (all labs ordered are listed, but only abnormal results are displayed) Labs Reviewed - No data to display  EKG   Radiology No results found.  Procedures Procedures (including critical care time)  Medications Ordered in UC Medications - No data to display  Initial Impression / Assessment and Plan / UC Course  I have reviewed the triage vital signs and the nursing notes.  Pertinent labs & imaging results that were available during my care of the patient were reviewed by me and considered in my medical decision making (see chart for details).    Plan: The diagnosis will be treated with the following: 1.  Upper respiratory infection: A.  Mucinex DM every 12 hours to control cough and congestion. 2.  Acute bronchitis: A.  Albuterol inhaler with spacer, 2 puffs every 6 hours on a regular basis to help decrease cough, congestion, and wheezing. B.  Tylenol or  ibuprofen as needed for body aches and discomfort. 3.  Advised follow-up PCP or return to urgent care if symptoms fail to improve. Final Clinical Impressions(s) / UC Diagnoses   Final diagnoses:  Acute upper respiratory infection  Acute bronchitis, unspecified organism     Discharge Instructions      Advised take the Mucinex DM every 12 hours as this will help decrease cough and congestion. Advised to use the albuterol inhaler with spacer, 2 puffs every 6 hours on a regular basis as this will help decrease wheezing, cough, and shortness of breath.  Advised to increase fluid intake to help thin the secretions. Advised take Tylenol or ibuprofen as needed for fever or bodyaches.  Advised follow-up PCP or return to urgent care as needed.    ED Prescriptions     Medication Sig Dispense Auth. Provider   dextromethorphan-guaiFENesin (MUCINEX DM) 30-600 MG 12hr tablet Take 1 tablet by mouth 2 (two) times daily. 20 tablet Nyoka Lint, PA-C   albuterol (VENTOLIN HFA) 108 (90 Base) MCG/ACT inhaler Inhale 2 puffs into the lungs every 6 (six) hours as needed for wheezing or shortness of breath. 8 g Nyoka Lint, PA-C   Spacer/Aero-Holding Chambers DEVI 1 Units by Does not apply route in the morning, at noon, and at bedtime. 1 Units Nyoka Lint, PA-C      PDMP not reviewed this encounter.   Nyoka Lint, PA-C 06/24/22 1121

## 2022-06-24 NOTE — Discharge Instructions (Signed)
Advised take the Mucinex DM every 12 hours as this will help decrease cough and congestion. Advised to use the albuterol inhaler with spacer, 2 puffs every 6 hours on a regular basis as this will help decrease wheezing, cough, and shortness of breath.  Advised to increase fluid intake to help thin the secretions. Advised take Tylenol or ibuprofen as needed for fever or bodyaches.  Advised follow-up PCP or return to urgent care as needed.

## 2022-06-24 NOTE — ED Triage Notes (Signed)
Patient c/o cough, congestion, sore throat x 1 day.  Patient has taken Advil Cold & Sinus.

## 2022-07-15 ENCOUNTER — Ambulatory Visit
Admission: EM | Admit: 2022-07-15 | Discharge: 2022-07-15 | Disposition: A | Discharge status: Home / Self Care | Payer: BC Managed Care – PPO | Attending: Family Medicine | Admitting: Family Medicine

## 2022-07-15 DIAGNOSIS — R070 Pain in throat: Secondary | ICD-10-CM | POA: Diagnosis not present

## 2022-07-15 LAB — POCT RAPID STREP A (OFFICE): Rapid Strep A Screen: NEGATIVE

## 2022-07-15 MED ORDER — AMOXICILLIN 875 MG PO TABS: 875.0000 mg | ORAL_TABLET | Freq: Two times a day (BID) | ORAL | 0 refills | Status: AC

## 2022-07-15 MED ORDER — IBUPROFEN 800 MG PO TABS: 800.0000 mg | ORAL_TABLET | Freq: Three times a day (TID) | ORAL | 0 refills | Status: AC | PRN

## 2022-07-15 NOTE — ED Triage Notes (Signed)
Pt states she woke up this morning with a sore throat.

## 2022-07-15 NOTE — ED Provider Notes (Signed)
[facility]    CSN: [id number] Arrival date & time: 07/15/22  1212      History   Chief Complaint Chief Complaint  Patient presents with   Sore Throat    HPI Deanna Sanchez is a 62 y.o. female.    Sore Throat   Here for sore throat first noted this AM. No f/c and no n/v/c. No cough   Seen here about 3 weeks ago and had URI and inhaler is helping, and those symptoms have improved.  No known exposures. Works at [employer] Past Medical History:  Diagnosis Date   Cancer [facility])    Headache    Hypertension     Patient Active Problem List   Diagnosis Date Noted   Hypokalemia 05/15/2017   EKG abnormalities 05/15/2017   Hypertension 05/15/2017   Vertigo 05/15/2017   Headache 05/15/2017    History reviewed. No pertinent surgical history.  OB History   No obstetric history on file.      Home Medications    Prior to Admission medications   Medication Sig Start Date End Date Taking? Authorizing Provider  amoxicillin (AMOXIL) 875 MG tablet Take 1 tablet (875 mg total) by mouth 2 (two) times daily for 10 days. 07/15/22 07/25/22 Yes Barrett Henle, MD  ibuprofen (ADVIL) 800 MG tablet Take 1 tablet (800 mg total) by mouth every 8 (eight) hours as needed (pain). 07/15/22  Yes Barrett Henle, MD  albuterol (VENTOLIN HFA) 108 (90 Base) MCG/ACT inhaler Inhale 2 puffs into the lungs every 6 (six) hours as needed for wheezing or shortness of breath. 06/24/22   Nyoka Lint, PA-C  ALPRAZolam Duanne Moron) 0.25 MG tablet Take 0.25 mg by mouth daily as needed for anxiety.    [provider]  amitriptyline (ELAVIL) 25 MG tablet Take 50 mg by mouth at bedtime. 03/28/17   [provider]  aspirin EC 81 MG EC tablet Take 1 tablet (81 mg total) by mouth daily. 05/16/17   Kinnie Feil, MD  losartan (COZAAR) 100 MG tablet Take 1 tablet (100 mg total) by mouth daily. 05/16/17   Kinnie Feil, MD  meclizine (ANTIVERT) 25 MG tablet Take 1 tablet (25  mg total) by mouth 3 (three) times daily as needed for dizziness. 05/15/17   Kinnie Feil, MD  Multiple Vitamin (MULTIVITAMIN) tablet Take 1 tablet by mouth daily.    [provider]  PARoxetine (PAXIL-CR) 25 MG 24 hr tablet Take 50 mg by mouth daily. 05/15/17   [provider]  Spacer/Aero-Holding Josiah Lobo DEVI 1 Units by Does not apply route in the morning, at noon, and at bedtime. 06/24/22   Nyoka Lint, PA-C  tamoxifen (NOLVADEX) 20 MG tablet Take 20 mg by mouth daily. 03/23/17   [provider]    Family History Family History  Problem Relation Age of Onset   Coronary artery disease Neg Hx     Social History Social History   Tobacco Use   Smoking status: Never   Smokeless tobacco: Never  Vaping Use   Vaping Use: Never used  Substance Use Topics   Alcohol use: No   Drug use: No     Allergies   Morphine and related, Sulfa antibiotics, and Adhesive [tape]   Review of Systems Review of Systems   Physical Exam Triage Vital Signs ED Triage Vitals  Enc Vitals Group     BP 07/15/22 1221 (!) 142/80     Pulse Rate 07/15/22 1221 (!) 101  Resp 07/15/22 1221 16     Temp 07/15/22 1220 97.7 F (36.5 C)     Temp Source 07/15/22 1220 Oral     SpO2 07/15/22 1221 97 %     Weight --      Height --      Head Circumference --      Peak Flow --      Pain Score 07/15/22 1221 10     Pain Loc --      Pain Edu? --      Excl. in [place]? --    No data found.  Updated Vital Signs BP (!) 142/80 (BP Location: Left Arm)   Pulse (!) 101   Temp 97.7 F (36.5 C) (Oral)   Resp 16   SpO2 97%   Visual Acuity Right Eye Distance:   Left Eye Distance:   Bilateral Distance:    Right Eye Near:   Left Eye Near:    Bilateral Near:     Physical Exam Vitals reviewed.  Constitutional:      General: She is not in acute distress.    Appearance: She is not toxic-appearing.  HENT:     Right Ear: Tympanic membrane and ear canal normal.     Left Ear:  Tympanic membrane and ear canal normal.     Nose: Nose normal.     Mouth/Throat:     Mouth: Mucous membranes are moist.     Comments: There is beefy erythema of the soft palate and of the tonsils bilaterally. No asymmetry. Tonsils 2+ Eyes:     Extraocular Movements: Extraocular movements intact.     Conjunctiva/sclera: Conjunctivae normal.     Pupils: Pupils are equal, round, and reactive to light.  Cardiovascular:     Rate and Rhythm: Normal rate and regular rhythm.     Heart sounds: No murmur heard. Pulmonary:     Effort: Pulmonary effort is normal. No respiratory distress.     Breath sounds: No stridor. No wheezing, rhonchi or rales.  Musculoskeletal:     Cervical back: Neck supple.  Lymphadenopathy:     Cervical: No cervical adenopathy.  Skin:    Capillary Refill: Capillary refill takes less than 2 seconds.     Coloration: Skin is not jaundiced or pale.  Neurological:     General: No focal deficit present.     Mental Status: She is alert and oriented to person, place, and time.  Psychiatric:        Behavior: Behavior normal.      UC Treatments / Results  Labs (all labs ordered are listed, but only abnormal results are displayed) Labs Reviewed  POCT RAPID STREP A (OFFICE)    EKG   Radiology No results found.  Procedures Procedures (including critical care time)  Medications Ordered in UC Medications - No data to display  Initial Impression / Assessment and Plan / UC Course  I have reviewed the triage vital signs and the nursing notes.  Pertinent labs & imaging results that were available during my care of the patient were reviewed by me and considered in my medical decision making (see chart for details).        Rapid strep is negative.  I will go ahead and treat first strep and possible false negative test. Final Clinical Impressions(s) / UC Diagnoses   Final diagnoses:  Throat pain   Discharge Instructions   None    ED Prescriptions      Medication Sig Dispense Auth. Provider  amoxicillin (AMOXIL) 875 MG tablet Take 1 tablet (875 mg total) by mouth 2 (two) times daily for 10 days. 20 tablet Barrett Henle, MD   ibuprofen (ADVIL) 800 MG tablet Take 1 tablet (800 mg total) by mouth every 8 (eight) hours as needed (pain). 21 tablet Bartolo Montanye, Gwenlyn Perking, MD      PDMP not reviewed this encounter.   Barrett Henle, MD 07/15/22 [id number]

## 2022-07-15 NOTE — Discharge Instructions (Signed)
The rapid strep test was negative  This could be very well viral and origin  Antibiotics are sent in in case the rapid strep test is not falsely negative.  Take amoxicillin 875 mg--1 tab twice daily for 10 days

## 2022-09-01 DIAGNOSIS — G43109 Migraine with aura, not intractable, without status migrainosus: Secondary | ICD-10-CM | POA: Diagnosis not present

## 2022-09-01 DIAGNOSIS — G479 Sleep disorder, unspecified: Secondary | ICD-10-CM | POA: Diagnosis not present

## 2022-09-01 DIAGNOSIS — F411 Generalized anxiety disorder: Secondary | ICD-10-CM | POA: Diagnosis not present

## 2022-09-06 DIAGNOSIS — J45901 Unspecified asthma with (acute) exacerbation: Secondary | ICD-10-CM | POA: Diagnosis not present

## 2022-09-06 DIAGNOSIS — L209 Atopic dermatitis, unspecified: Secondary | ICD-10-CM | POA: Diagnosis not present

## 2022-09-06 DIAGNOSIS — R051 Acute cough: Secondary | ICD-10-CM | POA: Diagnosis not present

## 2022-12-01 ENCOUNTER — Ambulatory Visit: Payer: Self-pay | Admitting: Allergy

## 2022-12-16 DIAGNOSIS — K580 Irritable bowel syndrome with diarrhea: Secondary | ICD-10-CM | POA: Diagnosis not present

## 2022-12-16 DIAGNOSIS — Z800 Family history of malignant neoplasm of digestive organs: Secondary | ICD-10-CM | POA: Diagnosis not present

## 2023-01-28 DIAGNOSIS — D225 Melanocytic nevi of trunk: Secondary | ICD-10-CM | POA: Diagnosis not present

## 2023-01-28 DIAGNOSIS — D2239 Melanocytic nevi of other parts of face: Secondary | ICD-10-CM | POA: Diagnosis not present

## 2023-01-28 DIAGNOSIS — L853 Xerosis cutis: Secondary | ICD-10-CM | POA: Diagnosis not present

## 2023-01-28 DIAGNOSIS — L820 Inflamed seborrheic keratosis: Secondary | ICD-10-CM | POA: Diagnosis not present

## 2023-01-28 DIAGNOSIS — L570 Actinic keratosis: Secondary | ICD-10-CM | POA: Diagnosis not present

## 2023-01-28 DIAGNOSIS — D485 Neoplasm of uncertain behavior of skin: Secondary | ICD-10-CM | POA: Diagnosis not present

## 2023-02-22 ENCOUNTER — Other Ambulatory Visit: Payer: Self-pay

## 2023-02-22 ENCOUNTER — Ambulatory Visit
Admission: EM | Admit: 2023-02-22 | Discharge: 2023-02-22 | Disposition: A | Discharge status: Home / Self Care | Payer: BC Managed Care – PPO | Attending: Physician Assistant | Admitting: Physician Assistant

## 2023-02-22 DIAGNOSIS — M5441 Lumbago with sciatica, right side: Secondary | ICD-10-CM

## 2023-02-22 MED ORDER — TIZANIDINE HCL 4 MG PO TABS: 2.0000 mg | ORAL_TABLET | Freq: Four times a day (QID) | ORAL | 0 refills | Status: AC | PRN

## 2023-02-22 MED ORDER — PREDNISONE 20 MG PO TABS: 40.0000 mg | ORAL_TABLET | Freq: Every day | ORAL | 0 refills | Status: AC

## 2023-02-22 NOTE — ED Provider Notes (Signed)
[facility]    CSN: [id number] Arrival date & time: 02/22/23  1019      History   Chief Complaint No chief complaint on file.   HPI Deanna Sanchez is a 62 y.o. female.   Patient here today for evaluation of lower back pain that started about 3 days ago.  She reports pain is present to her right lower back and she woke up with symptoms.  She denies any known injury.  She states that back will feel stiff and she has radiation down her right leg at times.  She has tried using a heating pad and [employer]. without resolution.  She denies any numbness or tingling.  She has not any loss of bowel or bladder function.  She has tried taking ibuprofen without resolution as well.  The history is provided by the patient.    Past Medical History:  Diagnosis Date   Cancer [facility])    Headache    Hypertension     Patient Active Problem List   Diagnosis Date Noted   Hypokalemia 05/15/2017   EKG abnormalities 05/15/2017   Hypertension 05/15/2017   Vertigo 05/15/2017   Headache 05/15/2017    History reviewed. No pertinent surgical history.  OB History   No obstetric history on file.      Home Medications    Prior to Admission medications   Medication Sig Start Date End Date Taking? Authorizing Provider  predniSONE (DELTASONE) 20 MG tablet Take 2 tablets (40 mg total) by mouth daily with breakfast for 5 days. 02/22/23 02/27/23 Yes Tomi Bamberger, PA-C  tiZANidine (ZANAFLEX) 4 MG tablet Take 0.5-1 tablets (2-4 mg total) by mouth every 6 (six) hours as needed for muscle spasms. 02/22/23  Yes Tomi Bamberger, PA-C  albuterol (VENTOLIN HFA) 108 (90 Base) MCG/ACT inhaler Inhale 2 puffs into the lungs every 6 (six) hours as needed for wheezing or shortness of breath. 06/24/22   Ellsworth Lennox, PA-C  ALPRAZolam Prudy Feeler) 0.25 MG tablet Take 0.25 mg by mouth daily as needed for anxiety.    [provider]  amitriptyline (ELAVIL) 25 MG tablet Take 50 mg by mouth at bedtime.  03/28/17   [provider]  aspirin EC 81 MG EC tablet Take 1 tablet (81 mg total) by mouth daily. 05/16/17   Esperanza Sheets, MD  ibuprofen (ADVIL) 800 MG tablet Take 1 tablet (800 mg total) by mouth every 8 (eight) hours as needed (pain). 07/15/22   Zenia Resides, MD  losartan (COZAAR) 100 MG tablet Take 1 tablet (100 mg total) by mouth daily. 05/16/17   Esperanza Sheets, MD  meclizine (ANTIVERT) 25 MG tablet Take 1 tablet (25 mg total) by mouth 3 (three) times daily as needed for dizziness. 05/15/17   Esperanza Sheets, MD  Multiple Vitamin (MULTIVITAMIN) tablet Take 1 tablet by mouth daily.    [provider]  PARoxetine (PAXIL-CR) 25 MG 24 hr tablet Take 50 mg by mouth daily. 05/15/17   [provider]  Spacer/Aero-Holding Deretha Emory DEVI 1 Units by Does not apply route in the morning, at noon, and at bedtime. 06/24/22   Ellsworth Lennox, PA-C  tamoxifen (NOLVADEX) 20 MG tablet Take 20 mg by mouth daily. 03/23/17   [provider]    Family History Family History  Problem Relation Age of Onset   Coronary artery disease Neg Hx     Social History Social History   Tobacco Use   Smoking status: Never  Smokeless tobacco: Never  Vaping Use   Vaping status: Never Used  Substance Use Topics   Alcohol use: No   Drug use: No     Allergies   Morphine and codeine, Sulfa antibiotics, and Adhesive [tape]   Review of Systems Review of Systems  Constitutional:  Negative for chills and fever.  Eyes:  Negative for discharge and redness.  Gastrointestinal:  Negative for abdominal pain, nausea and vomiting.  Genitourinary:  Negative for dysuria and hematuria.  Musculoskeletal:  Positive for back pain and myalgias.  Neurological:  Negative for numbness.     Physical Exam Triage Vital Signs ED Triage Vitals  Encounter Vitals Group     BP 02/22/23 1147 128/83     Systolic BP Percentile --      Diastolic BP Percentile --      Pulse Rate 02/22/23  1147 74     Resp --      Temp 02/22/23 1147 98.4 F (36.9 C)     Temp Source 02/22/23 1147 Oral     SpO2 02/22/23 1147 95 %     Weight 02/22/23 1145 160 lb (72.6 kg)     Height 02/22/23 1145 5\' 1"  (1.549 m)     Head Circumference --      Peak Flow --      Pain Score 02/22/23 1145 10     Pain Loc --      Pain Education --      Exclude from Growth Chart --    No data found.  Updated Vital Signs BP 128/83 (BP Location: Left Arm)   Pulse 74   Temp 98.4 F (36.9 C) (Oral)   Ht 5\' 1"  (1.549 m)   Wt 160 lb (72.6 kg)   SpO2 95%   BMI 30.23 kg/m      Physical Exam Vitals and nursing note reviewed.  Constitutional:      General: She is not in acute distress.    Appearance: Normal appearance. She is not ill-appearing.  HENT:     Head: Normocephalic and atraumatic.  Eyes:     Conjunctiva/sclera: Conjunctivae normal.  Cardiovascular:     Rate and Rhythm: Normal rate.  Pulmonary:     Effort: Pulmonary effort is normal. No respiratory distress.  Musculoskeletal:     Comments: No tenderness to palpation to midline spine.  Mild tenderness palpation noted to right lower back.  Neurological:     Mental Status: She is alert.  Psychiatric:        Mood and Affect: Mood normal.        Behavior: Behavior normal.        Thought Content: Thought content normal.      UC Treatments / Results  Labs (all labs ordered are listed, but only abnormal results are displayed) Labs Reviewed - No data to display  EKG   Radiology No results found.  Procedures Procedures (including critical care time)  Medications Ordered in UC Medications - No data to display  Initial Impression / Assessment and Plan / UC Course  I have reviewed the triage vital signs and the nursing notes.  Pertinent labs & imaging results that were available during my care of the patient were reviewed by me and considered in my medical decision making (see chart for details).    Suspect muscular strain.  Will  treat with steroid burst and muscle relaxer.  Encouraged follow-up if no gradual improvement or with any further concerns.  Final Clinical Impressions(s) / UC  Diagnoses   Final diagnoses:  Acute right-sided low back pain with right-sided sciatica   Discharge Instructions   None    ED Prescriptions     Medication Sig Dispense Auth. Provider   predniSONE (DELTASONE) 20 MG tablet Take 2 tablets (40 mg total) by mouth daily with breakfast for 5 days. 10 tablet Erma Pinto F, PA-C   tiZANidine (ZANAFLEX) 4 MG tablet Take 0.5-1 tablets (2-4 mg total) by mouth every 6 (six) hours as needed for muscle spasms. 30 tablet Tomi Bamberger, PA-C      PDMP not reviewed this encounter.   Tomi Bamberger, PA-C 02/22/23 1214

## 2023-02-22 NOTE — ED Triage Notes (Signed)
Patient reports pain in right lower back x day 3. Treated with heating pad and absorbine jr without relief.

## 2023-06-14 DIAGNOSIS — M1711 Unilateral primary osteoarthritis, right knee: Secondary | ICD-10-CM | POA: Diagnosis not present

## 2023-07-19 DIAGNOSIS — M1711 Unilateral primary osteoarthritis, right knee: Secondary | ICD-10-CM | POA: Diagnosis not present

## 2023-11-23 DIAGNOSIS — R070 Pain in throat: Secondary | ICD-10-CM | POA: Diagnosis not present

## 2023-11-23 DIAGNOSIS — I10 Essential (primary) hypertension: Secondary | ICD-10-CM | POA: Diagnosis not present

## 2023-11-23 DIAGNOSIS — H6121 Impacted cerumen, right ear: Secondary | ICD-10-CM | POA: Diagnosis not present
